# Patient Record
Sex: Female | Born: 1945
Health system: Southern US, Community
[De-identification: ages and names within clinical notes are randomized; demographics above are authoritative.]

## PROBLEM LIST (undated history)

## (undated) DIAGNOSIS — E039 Hypothyroidism, unspecified: Secondary | ICD-10-CM

## (undated) HISTORY — PX: MULTIPLE TOOTH EXTRACTIONS: SHX2053

## (undated) HISTORY — PX: TONSILLECTOMY: SUR1361

---

## 2000-08-17 ENCOUNTER — Ambulatory Visit (HOSPITAL_COMMUNITY): Admission: RE | Admit: 2000-08-17 | Discharge: 2000-08-17 | Payer: Self-pay | Admitting: Gastroenterology

## 2002-12-25 ENCOUNTER — Encounter: Payer: Self-pay | Admitting: Gastroenterology

## 2002-12-25 ENCOUNTER — Encounter: Admission: RE | Admit: 2002-12-25 | Discharge: 2002-12-25 | Payer: Self-pay | Admitting: Gastroenterology

## 2011-09-06 ENCOUNTER — Observation Stay (HOSPITAL_COMMUNITY)
Admission: EM | Admit: 2011-09-06 | Discharge: 2011-09-07 | Disposition: A | Discharge status: Home / Self Care | Payer: BC Managed Care – PPO | Attending: Internal Medicine | Admitting: Internal Medicine

## 2011-09-06 ENCOUNTER — Encounter: Payer: Self-pay | Admitting: *Deleted

## 2011-09-06 ENCOUNTER — Emergency Department (HOSPITAL_COMMUNITY): Payer: BC Managed Care – PPO

## 2011-09-06 ENCOUNTER — Other Ambulatory Visit: Payer: Self-pay

## 2011-09-06 DIAGNOSIS — E039 Hypothyroidism, unspecified: Secondary | ICD-10-CM | POA: Diagnosis present

## 2011-09-06 DIAGNOSIS — R42 Dizziness and giddiness: Secondary | ICD-10-CM | POA: Insufficient documentation

## 2011-09-06 DIAGNOSIS — Z79899 Other long term (current) drug therapy: Secondary | ICD-10-CM | POA: Insufficient documentation

## 2011-09-06 DIAGNOSIS — Z7982 Long term (current) use of aspirin: Secondary | ICD-10-CM | POA: Insufficient documentation

## 2011-09-06 DIAGNOSIS — R002 Palpitations: Secondary | ICD-10-CM | POA: Diagnosis present

## 2011-09-06 DIAGNOSIS — I4891 Unspecified atrial fibrillation: Principal | ICD-10-CM | POA: Insufficient documentation

## 2011-09-06 DIAGNOSIS — M542 Cervicalgia: Secondary | ICD-10-CM | POA: Insufficient documentation

## 2011-09-06 DIAGNOSIS — R0789 Other chest pain: Secondary | ICD-10-CM | POA: Insufficient documentation

## 2011-09-06 DIAGNOSIS — I499 Cardiac arrhythmia, unspecified: Secondary | ICD-10-CM

## 2011-09-06 HISTORY — DX: Hypothyroidism, unspecified: E03.9

## 2011-09-06 LAB — CARDIAC PANEL(CRET KIN+CKTOT+MB+TROPI)
CK, MB: 4.4 ng/mL — ABNORMAL HIGH (ref 0.3–4.0)
Relative Index: 1.9 (ref 0.0–2.5)
Total CK: 229 U/L — ABNORMAL HIGH (ref 7–177)
Troponin I: <0.3 ng/mL (ref <0.30)

## 2011-09-06 LAB — CBC
HCT: 39.8 % (ref 36.0–46.0)
Hemoglobin: 12.9 g/dL (ref 12.0–15.0)
MCH: 24.6 pg — ABNORMAL LOW (ref 26.0–34.0)
MCHC: 32.4 g/dL (ref 30.0–36.0)
MCV: 75.8 fL — ABNORMAL LOW (ref 78.0–100.0)
Platelets: 190 10*3/uL (ref 150–400)
RBC: 5.25 MIL/uL — ABNORMAL HIGH (ref 3.87–5.11)
RDW: 14.1 % (ref 11.5–15.5)
WBC: 6.3 10*3/uL (ref 4.0–10.5)

## 2011-09-06 LAB — COMPREHENSIVE METABOLIC PANEL
ALT: 23 U/L (ref 0–35)
AST: 30 U/L (ref 0–37)
Albumin: 3.9 g/dL (ref 3.5–5.2)
Alkaline Phosphatase: 90 U/L (ref 39–117)
BUN: 13 mg/dL (ref 6–23)
CO2: 29 mEq/L (ref 19–32)
Calcium: 9.6 mg/dL (ref 8.4–10.5)
Chloride: 102 mEq/L (ref 96–112)
Creatinine, Ser: 0.9 mg/dL (ref 0.50–1.10)
GFR calc Af Amer: 77 mL/min — ABNORMAL LOW (ref >90)
GFR calc non Af Amer: 66 mL/min — ABNORMAL LOW (ref >90)
Glucose, Bld: 100 mg/dL — ABNORMAL HIGH (ref 70–99)
Potassium: 4.2 mEq/L (ref 3.5–5.1)
Sodium: 138 mEq/L (ref 135–145)
Total Bilirubin: 0.4 mg/dL (ref 0.3–1.2)
Total Protein: 7.3 g/dL (ref 6.0–8.3)

## 2011-09-06 LAB — DIFFERENTIAL
Basophils Absolute: 0.1 10*3/uL (ref 0.0–0.1)
Basophils Relative: 1 % (ref 0–1)
Eosinophils Absolute: 0.3 10*3/uL (ref 0.0–0.7)
Eosinophils Relative: 5 % (ref 0–5)
Lymphocytes Relative: 36 % (ref 12–46)
Lymphs Abs: 2.3 10*3/uL (ref 0.7–4.0)
Monocytes Absolute: 0.6 10*3/uL (ref 0.1–1.0)
Monocytes Relative: 10 % (ref 3–12)
Neutro Abs: 3.1 10*3/uL (ref 1.7–7.7)
Neutrophils Relative %: 49 % (ref 43–77)

## 2011-09-06 LAB — TSH: TSH: 2.218 u[IU]/mL (ref 0.350–4.500)

## 2011-09-06 LAB — APTT: aPTT: 28 seconds (ref 24–37)

## 2011-09-06 LAB — PROTIME-INR
INR: 0.95 (ref 0.00–1.49)
Prothrombin Time: 12.9 seconds (ref 11.6–15.2)

## 2011-09-06 LAB — T4, FREE: Free T4: 0.61 ng/dL — ABNORMAL LOW (ref 0.80–1.80)

## 2011-09-06 MED ORDER — ASPIRIN EC 81 MG PO TBEC
81.0000 mg | DELAYED_RELEASE_TABLET | Freq: Every day | ORAL | Status: DC
  Administered 2011-09-07: 81 mg via ORAL
  Filled 2011-09-06 (×3): qty 1

## 2011-09-06 MED ORDER — FLUOXETINE HCL 20 MG PO CAPS
20.0000 mg | ORAL_CAPSULE | Freq: Every day | ORAL | Status: DC
  Administered 2011-09-07: 20 mg via ORAL
  Filled 2011-09-06 (×3): qty 1

## 2011-09-06 MED ORDER — ONDANSETRON HCL 4 MG PO TABS: 4.0000 mg | ORAL_TABLET | Freq: Four times a day (QID) | ORAL | Status: DC | PRN

## 2011-09-06 MED ORDER — ACETAMINOPHEN 650 MG RE SUPP: 650.0000 mg | Freq: Four times a day (QID) | RECTAL | Status: DC | PRN

## 2011-09-06 MED ORDER — ONDANSETRON HCL 4 MG/2ML IJ SOLN: 4.0000 mg | Freq: Four times a day (QID) | INTRAMUSCULAR | Status: DC | PRN

## 2011-09-06 MED ORDER — ACETAMINOPHEN 325 MG PO TABS: 650.0000 mg | ORAL_TABLET | Freq: Four times a day (QID) | ORAL | Status: DC | PRN

## 2011-09-06 NOTE — ED Notes (Signed)
Pt denies n/v, states "took a meclizine this a.m. For the dizziness"

## 2011-09-06 NOTE — Progress Notes (Signed)
Pt requesting not to have an IV if has nothing ordered.  "I have great veins if they need to start an IV for something"  Primary RN made aware and is calling MD.  Pt. Has nothing ordered IV except Zofran which is also ordered PO.

## 2011-09-06 NOTE — H&P (Signed)
PCP:   No primary provider on file.   Chief Complaint:  palpitations  HPI: This is a very pleasant 65 year old female, with past medical history of hypothyroidism. Patient has been self-medicating herself with thyroid replacement medicines including both levothyroxine and Cytomel. She doses herself based upon her symptoms. If she starts gaining weight, losing hair, feeling increasingly fatigued she will increase her dose of levothyroxine. If she starts to feel jittery and she will cut her dose in half. Today she was feeling jittery and was noted to have some palpitations. She took one dose of Inderal to help her symptoms. She reports recently having an upper respiratory tract infection on Friday and Saturday where she had cough and congestion. She had taken NyQuil for this and that has since resolved her symptoms. Today she complains of palpitations, lightheadedness, occasional left neck pain. She denies any chest pain, shortness of breath, nausea, vomiting, diarrhea, insomnia. She does feel very jittery. In the ER she was evaluated and was found to be in atrial fibrillation with rapid ventricular response. On EKG she was noted to have a heart rate at 118. She was monitored in the emergency room and has been referred for admission for further workup.  Allergies:  No Known Allergies    Past Medical History  Diagnosis Date  . Hypothyroidism     Past Surgical History  Procedure Date  . Tonsillectomy     Prior to Admission medications   Medication Sig Start Date End Date Taking? Authorizing Provider  aspirin EC 81 MG tablet Take 81 mg by mouth daily.     Yes Historical Provider, MD  FLUoxetine (PROZAC) 20 MG capsule Take 20 mg by mouth daily.     Yes Historical Provider, MD  levothyroxine (SYNTHROID, LEVOTHROID) 100 MCG tablet Take 100 mcg by mouth daily.     Yes Historical Provider, MD  liothyronine (CYTOMEL) 25 MCG tablet Take 25 mcg by mouth daily.     Yes Historical Provider, MD    meclizine (ANTIVERT) 25 MG tablet Take 25 mg by mouth 3 (three) times daily as needed. DIZZINESS    Yes Historical Provider, MD  Pseudoeph-Doxylamine-DM-APAP (NYQUIL) 60-7.02-09-999 MG/30ML LIQD Take 1 capsule by mouth every 6 (six) hours. COUGH/COLD    Yes Historical Provider, MD    Social History:  reports that she has quit smoking. She does not have any smokeless tobacco history on file. She reports that she drinks about .6 ounces of alcohol per week. She reports that she does not use illicit drugs. she is a former Engineer, civil (consulting), her husband is a retired Development worker, community, her daughter is a Diplomatic Services operational officer.  No family history on file.  Review of Systems: Positives on bold Constitutional: Denies fever, chills, diaphoresis, appetite change and fatigue.  HEENT: Denies photophobia, eye pain, redness, hearing loss, ear pain, congestion, sore throat, rhinorrhea, sneezing, mouth sores, trouble swallowing, neck pain, neck stiffness and tinnitus.   Respiratory: Denies SOB, DOE, cough, chest tightness,  and wheezing.   Cardiovascular: Denies chest pain, palpitations and leg swelling.  Gastrointestinal: Denies nausea, vomiting, abdominal pain, diarrhea, constipation, blood in stool and abdominal distention.  Genitourinary: Denies dysuria, urgency, frequency, hematuria, flank pain and difficulty urinating.  Musculoskeletal: Denies myalgias, back pain, joint swelling, arthralgias and gait problem.  Skin: Denies pallor, rash and wound.  Neurological: Denies dizziness, seizures, syncope, weakness, light-headedness, numbness and headaches.  Hematological: Denies adenopathy. Easy bruising, personal or family bleeding history  Psychiatric/Behavioral: Denies suicidal ideation, mood changes, confusion, nervousness, sleep disturbance and  agitation   Physical Exam: Blood pressure 149/47, pulse 54, temperature 98.9 F (37.2 C), temperature source Oral, resp. rate 19, height 5\' 9"  (1.753 m), weight 84.823 kg (187  lb), SpO2 100.00%. Gen.: In no acute distress, lying in bed, alert, oriented x3 HEENT: Normocephalic, atraumatic, pupils are equal round react to light Neck: Supple Chest: Clear to auscultation bilaterally Cardiac: S1, S2, regular rate and rhythm Abdomen: Soft, nontender, nondistended, bowel sounds are active Extremities: No cyanosis, clubbing, edema Neurologic: Patient has equal strength bilaterally, cranial nerves II through XII are grossly intact Skin is warm, no visible rashes  Labs on Admission:  Results for orders placed during the hospital encounter of 09/06/11 (from the past 48 hour(s))  CBC     Status: Abnormal   Collection Time   09/06/11  1:05 PM      Component Value Range Comment   WBC 6.3  4.0 - 10.5 (K/uL)    RBC 5.25 (*) 3.87 - 5.11 (MIL/uL)    Hemoglobin 12.9  12.0 - 15.0 (g/dL)    HCT 78.2  95.6 - 21.3 (%)    MCV 75.8 (*) 78.0 - 100.0 (fL)    MCH 24.6 (*) 26.0 - 34.0 (pg)    MCHC 32.4  30.0 - 36.0 (g/dL)    RDW 08.6  57.8 - 46.9 (%)    Platelets 190  150 - 400 (K/uL)   DIFFERENTIAL     Status: Normal   Collection Time   09/06/11  1:05 PM      Component Value Range Comment   Neutrophils Relative 49  43 - 77 (%)    Neutro Abs 3.1  1.7 - 7.7 (K/uL)    Lymphocytes Relative 36  12 - 46 (%)    Lymphs Abs 2.3  0.7 - 4.0 (K/uL)    Monocytes Relative 10  3 - 12 (%)    Monocytes Absolute 0.6  0.1 - 1.0 (K/uL)    Eosinophils Relative 5  0 - 5 (%)    Eosinophils Absolute 0.3  0.0 - 0.7 (K/uL)    Basophils Relative 1  0 - 1 (%)    Basophils Absolute 0.1  0.0 - 0.1 (K/uL)   PROTIME-INR     Status: Normal   Collection Time   09/06/11  1:05 PM      Component Value Range Comment   Prothrombin Time 12.9  11.6 - 15.2 (seconds)    INR 0.95  0.00 - 1.49    APTT     Status: Normal   Collection Time   09/06/11  1:05 PM      Component Value Range Comment   aPTT 28  24 - 37 (seconds)   COMPREHENSIVE METABOLIC PANEL     Status: Abnormal   Collection Time   09/06/11  1:05  PM      Component Value Range Comment   Sodium 138  135 - 145 (mEq/L)    Potassium 4.2  3.5 - 5.1 (mEq/L)    Chloride 102  96 - 112 (mEq/L)    CO2 29  19 - 32 (mEq/L)    Glucose, Bld 100 (*) 70 - 99 (mg/dL)    BUN 13  6 - 23 (mg/dL)    Creatinine, Ser 6.29  0.50 - 1.10 (mg/dL)    Calcium 9.6  8.4 - 10.5 (mg/dL)    Total Protein 7.3  6.0 - 8.3 (g/dL)    Albumin 3.9  3.5 - 5.2 (g/dL)    AST 30  0 -  37 (U/L)    ALT 23  0 - 35 (U/L)    Alkaline Phosphatase 90  39 - 117 (U/L)    Total Bilirubin 0.4  0.3 - 1.2 (mg/dL)    GFR calc non Af Amer 66 (*) >90 (mL/min)    GFR calc Af Amer 77 (*) >90 (mL/min)   CARDIAC PANEL(CRET KIN+CKTOT+MB+TROPI)     Status: Abnormal   Collection Time   09/06/11  1:05 PM      Component Value Range Comment   Total CK 229 (*) 7 - 177 (U/L)    CK, MB 4.4 (*) 0.3 - 4.0 (ng/mL)    Troponin I <0.30  <0.30 (ng/mL)    Relative Index 1.9  0.0 - 2.5      Radiological Exams on Admission: Dg Chest 2 View  09/06/2011  *RADIOLOGY REPORT*  Clinical Data: Chest pain.  Cardiac arrhythmia.  CHEST - 2 VIEW  Comparison: None.  Findings: Heart size is within normal limits.  Both lungs are clear.  No evidence of pleural effusion.  No mass or lymphadenopathy identified.  Mild thoracic spine degenerative changes noted.  IMPRESSION: No active cardiopulmonary disease.  Original Report Authenticated By: Danae Orleans, M.D.    Assessment/Plan Principal Problem:  *Atrial fibrillation with RVR Active Problems:  Hypothyroidism  Palpitations  Plan:  It has appeared that patient has converted to sinus rhythm. Her heart rate is in the high 50s to low 60s. It is likely that her self-medication of thyroid supplementation is the cause of her arrhythmia. TSH and T4 have already been sent. Patient will be admitted to a telemetry bed. We will monitor her rhythm to see if any there is any recurrence. We will cycle her cardiac markers as well as get a 2-D echocardiogram. I have advised her to  discontinue her Cytomel as she can likely be controlled with a steady dose of levothyroxine. She will need outpatient followup and repeat thyroid studies. For now, we will hold her thyroid replacement, until we get an idea of where her thyroid function is. The pseudoephedrine she had recently taken for her upper respiratory tract illness may have also contributed to her rapid rate. Patient's CHADS score is 0 so we will continue her on aspirin. I will hold off on starting her on any scheduled beta blockers since her heart rate is already in the 50s to 60s. We will further monitor her heart rate to see if she has any recurrence of tachyarrhythmias. If she does have recurrence then potentially a small dose of beta blockers may be added on a scheduled basis. We're hopeful for discharge in the next 24-48 hours.  Time Spent on Admission:  Bettyanne Dittman [facility] 09/06/2011, 2:44 PM

## 2011-09-06 NOTE — Progress Notes (Signed)
09/06/11 1832 MD notified RN that he wanted a saline lock started in patient. Karie Schwalbe RN

## 2011-09-06 NOTE — ED Notes (Signed)
VS taken @ 1144 documented on incorrect pt.

## 2011-09-06 NOTE — ED Provider Notes (Signed)
History     CSN: [id number]  Arrival date & time 09/06/11  1135   First MD Initiated Contact with Patient 09/06/11 1222      Chief Complaint  Patient presents with  . Chest Pain    (Consider location/radiation/quality/duration/timing/severity/associated sxs/prior treatment) Patient is a 65 y.o. female presenting with chest pain. The history is provided by the patient.  Chest Pain    patient here with palpitations and dizziness. Notes some cough vomiting and diarrhea. Patient took some Inderal 10 mg prior to arrival without relief of her symptoms. She is a sure of when the palpitations started. She does have a history of hypothyroidism and takes medications for this. Denies any prior history of atrial fibrillation. She only has chest discomfort when coughing. Diarrhea has been nonbloody vomiting has been nonbilious. She denies any rashes. Nothing makes her symptoms better.  Patient also took meclizine for dizziness as well, denies any headache or focal neurological deficits  Past Medical History  Diagnosis Date  . Hypothyroidism     Past Surgical History  Procedure Date  . Tonsillectomy     No family history on file.  History  Substance Use Topics  . Smoking status: Former Games developer  . Smokeless tobacco: Not on file  . Alcohol Use: 0.6 oz/week    1 Glasses of wine per week    OB History    Grav Para Term Preterm Abortions TAB SAB Ect Mult Living                  Review of Systems  Cardiovascular: Positive for chest pain.  All other systems reviewed and are negative.    Allergies  Review of patient's allergies indicates no known allergies.  Home Medications   Current Outpatient Rx  Name Route Sig Dispense Refill  . ASPIRIN EC 81 MG PO TBEC Oral Take 81 mg by mouth daily.      Marland Kitchen FLUOXETINE HCL 20 MG PO CAPS Oral Take 20 mg by mouth daily.      Marland Kitchen LEVOTHYROXINE SODIUM 100 MCG PO TABS Oral Take 100 mcg by mouth daily.      Marland Kitchen LIOTHYRONINE SODIUM 25 MCG PO TABS  Oral Take 25 mcg by mouth daily.      Marland Kitchen MECLIZINE HCL 25 MG PO TABS Oral Take 25 mg by mouth 3 (three) times daily as needed. DIZZINESS     . PSEUDOEPH-DOXYLAMINE-DM-APAP 60-7.02-09-999 MG/30ML PO LIQD Oral Take 1 capsule by mouth every 6 (six) hours. COUGH/COLD       BP 149/47  Pulse 54  Temp(Src) 98.9 F (37.2 C) (Oral)  Resp 19  Ht 5\' 9"  (1.753 m)  Wt 187 lb (84.823 kg)  BMI 27.62 kg/m2  SpO2 100%  Physical Exam  Nursing note and vitals reviewed. Constitutional: She is oriented to person, place, and time. She appears well-developed and well-nourished.  Non-toxic appearance. No distress.  HENT:  Head: Normocephalic and atraumatic.  Eyes: Conjunctivae, EOM and lids are normal. Pupils are equal, round, and reactive to light.  Neck: Normal range of motion. Neck supple. No tracheal deviation present. No mass present.  Cardiovascular: Normal heart sounds.  An irregularly irregular rhythm present. Tachycardia present.  Exam reveals no gallop.   No murmur heard. Pulmonary/Chest: Effort normal and breath sounds normal. No stridor. No respiratory distress. She has no decreased breath sounds. She has no wheezes. She has no rhonchi. She has no rales.  Abdominal: Soft. Normal appearance and bowel sounds are normal. She exhibits no  distension. There is no tenderness. There is no rebound and no CVA tenderness.  Musculoskeletal: Normal range of motion. She exhibits no edema and no tenderness.  Neurological: She is alert and oriented to person, place, and time. She has normal strength. No cranial nerve deficit or sensory deficit. GCS eye subscore is 4. GCS verbal subscore is 5. GCS motor subscore is 6.  Skin: Skin is warm and dry. No abrasion and no rash noted.  Psychiatric: She has a normal mood and affect. Her speech is normal and behavior is normal.    ED Course  Procedures (including critical care time)   Labs Reviewed  CBC  DIFFERENTIAL  PROTIME-INR  APTT  COMPREHENSIVE METABOLIC  PANEL  CARDIAC PANEL(CRET KIN+CKTOT+MB+TROPI)  T4, FREE  TSH   No results found.   No diagnosis found.    MDM   Date: 09/06/2011  Rate: 118  Rhythm: atrial fibrillation  QRS Axis: normal  Intervals: normal  ST/T Wave abnormalities: normal  Conduction Disutrbances:afib  Narrative Interpretation:   Old EKG Reviewed: unchanged    2:27 PM Patient's heart rate checked again she is in sinus rhythm at a rate of 55. She denies chest pain chest pressure. Patient had thyroid function studies drawn and are pending at this time. Patient to be admitted to the hospitalist for evaluation      Toy Baker, MD 09/06/11 1427

## 2011-09-07 ENCOUNTER — Other Ambulatory Visit: Payer: Self-pay

## 2011-09-07 LAB — T3, FREE: T3, Free: 3.1 pg/mL (ref 2.3–4.2)

## 2011-09-07 LAB — CBC
HCT: 35.3 % — ABNORMAL LOW (ref 36.0–46.0)
Hemoglobin: 11.6 g/dL — ABNORMAL LOW (ref 12.0–15.0)
MCH: 25 pg — ABNORMAL LOW (ref 26.0–34.0)
MCHC: 32.9 g/dL (ref 30.0–36.0)
MCV: 76.1 fL — ABNORMAL LOW (ref 78.0–100.0)
Platelets: 162 10*3/uL (ref 150–400)
RBC: 4.64 MIL/uL (ref 3.87–5.11)
RDW: 14.4 % (ref 11.5–15.5)
WBC: 5.3 10*3/uL (ref 4.0–10.5)

## 2011-09-07 LAB — BASIC METABOLIC PANEL
BUN: 14 mg/dL (ref 6–23)
CO2: 27 mEq/L (ref 19–32)
Calcium: 9.2 mg/dL (ref 8.4–10.5)
Chloride: 105 mEq/L (ref 96–112)
Creatinine, Ser: 1 mg/dL (ref 0.50–1.10)
GFR calc Af Amer: 67 mL/min — ABNORMAL LOW (ref >90)
GFR calc non Af Amer: 58 mL/min — ABNORMAL LOW (ref >90)
Glucose, Bld: 96 mg/dL (ref 70–99)
Potassium: 4 mEq/L (ref 3.5–5.1)
Sodium: 140 mEq/L (ref 135–145)

## 2011-09-07 LAB — CARDIAC PANEL(CRET KIN+CKTOT+MB+TROPI)
CK, MB: 3.2 ng/mL (ref 0.3–4.0)
CK, MB: 4.1 ng/mL — ABNORMAL HIGH (ref 0.3–4.0)
Relative Index: 2.2 (ref 0.0–2.5)
Relative Index: 2.3 (ref 0.0–2.5)
Total CK: 145 U/L (ref 7–177)
Total CK: 179 U/L — ABNORMAL HIGH (ref 7–177)
Troponin I: <0.3 ng/mL (ref <0.30)
Troponin I: <0.3 ng/mL (ref <0.30)

## 2011-09-07 MED ORDER — LEVOTHYROXINE SODIUM 50 MCG PO TABS: 50.0000 ug | ORAL_TABLET | Freq: Every day | ORAL | Status: DC

## 2011-09-07 NOTE — Progress Notes (Signed)
  Echocardiogram 2D Echocardiogram has been performed.  Juanita Laster Angelin Cutrone, RDCS 09/07/2011, 9:27 AM

## 2011-09-07 NOTE — Discharge Summary (Signed)
Physician Discharge Summary  Patient ID: Sara King MRN: [id number] DOB/AGE: 64-Feb-1947 64 y.o.  Admit date: 09/06/2011 Discharge date: 09/07/2011  Primary Care Physician:  No primary provider on file.   Discharge Diagnoses:    Principal Problem:  *Atrial fibrillation with RVR Active Problems:  Hypothyroidism  Palpitations    Discharge Medication List as of 09/07/2011 10:19 AM    CONTINUE these medications which have CHANGED   Details  levothyroxine (SYNTHROID) 50 MCG tablet Take 1 tablet (50 mcg total) by mouth daily., Starting 09/07/2011, Until Wed 09/06/12, Print      CONTINUE these medications which have NOT CHANGED   Details  aspirin EC 81 MG tablet Take 81 mg by mouth daily.  , Until Discontinued, Historical Med    FLUoxetine (PROZAC) 20 MG capsule Take 20 mg by mouth daily.  , Until Discontinued, Historical Med    meclizine (ANTIVERT) 25 MG tablet Take 25 mg by mouth 3 (three) times daily as needed. DIZZINESS , Until Discontinued, Historical Med      STOP taking these medications     liothyronine (CYTOMEL) 25 MCG tablet      Pseudoeph-Doxylamine-DM-APAP (NYQUIL) 60-7.02-09-999 MG/30ML LIQD          Disposition and Follow-up:  The patient will be discharged home today in stable and improved condition. She has been instructed to establish care with a new primary care physician in the area for followup on her hypothyroidism.  Consults:  none    Significant Diagnostic Studies:  No results found.  Brief H and P: For complete details please refer to admission H and P, but in brief patient is a very pleasant 64 year old white woman with past medical history only significant for hypothyroidism who came into the hospital with complaints of palpitations. Her husband is a retired Development worker, community and she has been getting prescriptions for thyroid replacement medications including Synthroid and Cytomel from him. She has not had routine lab work for her hypothyroidism.  She also recently had an upper respiratory infection and had taken over-the-counter NyQuil for this. In the emergency department she was found to be in atrial fibrillation with rapid ventricular response and we were asked to admit her for further evaluation and management.    Hospital Course:  Principal Problem:  *Atrial fibrillation with RVR Active Problems:  Hypothyroidism  Palpitations   #1 Atrial fibrillation with rapid ventricular response: This resolved spontaneously without intervention. I suspect this may have been secondary to over-the-counter medication containing decongestants used for her upper respiratory tract infection. She also doses her thyroid medications according to her symptoms, so if she feels increasingly fatigued or is gaining weight she will increase her dose of Synthroid and this may have precipitated some mild hyperthyroidism that may have resulted in atrial fibrillation. She converted spontaneously back to sinus rhythm with heart rates in the 60s.  #2 hypothyroidism: Her TSH was 2.218 which is normal, her free T4 however was low at 0.61. I have told her to start taking Synthroid 50 mcg daily and to followup with a primary care physician in 4-6 weeks for repeat thyroid function test to further assist with dosing adjustments. She has agreed to comply with this.  Time spent on Discharge: Greater than 30 minutes.  SignedChaya Jan [facility] Pager: [phone] 09/07/2011, 11:15 AM

## 2018-03-06 ENCOUNTER — Observation Stay (HOSPITAL_COMMUNITY)
Admission: EM | Admit: 2018-03-06 | Discharge: 2018-03-07 | Disposition: A | Discharge status: Home / Self Care | Payer: BLUE CROSS/BLUE SHIELD | Attending: Internal Medicine | Admitting: Internal Medicine

## 2018-03-06 ENCOUNTER — Other Ambulatory Visit: Payer: Self-pay

## 2018-03-06 ENCOUNTER — Emergency Department (HOSPITAL_COMMUNITY): Payer: BLUE CROSS/BLUE SHIELD

## 2018-03-06 ENCOUNTER — Encounter (HOSPITAL_COMMUNITY): Payer: Self-pay | Admitting: Pharmacy Technician

## 2018-03-06 DIAGNOSIS — Z87891 Personal history of nicotine dependence: Secondary | ICD-10-CM | POA: Insufficient documentation

## 2018-03-06 DIAGNOSIS — Z8249 Family history of ischemic heart disease and other diseases of the circulatory system: Secondary | ICD-10-CM | POA: Insufficient documentation

## 2018-03-06 DIAGNOSIS — R0789 Other chest pain: Principal | ICD-10-CM | POA: Insufficient documentation

## 2018-03-06 DIAGNOSIS — I4891 Unspecified atrial fibrillation: Secondary | ICD-10-CM | POA: Insufficient documentation

## 2018-03-06 DIAGNOSIS — Z9889 Other specified postprocedural states: Secondary | ICD-10-CM | POA: Diagnosis not present

## 2018-03-06 DIAGNOSIS — I420 Dilated cardiomyopathy: Secondary | ICD-10-CM | POA: Diagnosis not present

## 2018-03-06 DIAGNOSIS — R918 Other nonspecific abnormal finding of lung field: Secondary | ICD-10-CM | POA: Diagnosis not present

## 2018-03-06 DIAGNOSIS — Z7982 Long term (current) use of aspirin: Secondary | ICD-10-CM | POA: Diagnosis not present

## 2018-03-06 DIAGNOSIS — I493 Ventricular premature depolarization: Secondary | ICD-10-CM | POA: Diagnosis not present

## 2018-03-06 DIAGNOSIS — Z79899 Other long term (current) drug therapy: Secondary | ICD-10-CM | POA: Diagnosis not present

## 2018-03-06 DIAGNOSIS — Z79890 Hormone replacement therapy (postmenopausal): Secondary | ICD-10-CM | POA: Diagnosis not present

## 2018-03-06 DIAGNOSIS — E039 Hypothyroidism, unspecified: Secondary | ICD-10-CM

## 2018-03-06 DIAGNOSIS — R079 Chest pain, unspecified: Secondary | ICD-10-CM | POA: Diagnosis not present

## 2018-03-06 LAB — CBC WITH DIFFERENTIAL/PLATELET
Abs Immature Granulocytes: 0 10*3/uL (ref 0.0–0.1)
Basophils Absolute: 0.1 10*3/uL (ref 0.0–0.1)
Basophils Relative: 1 %
Eosinophils Absolute: 0.1 10*3/uL (ref 0.0–0.7)
Eosinophils Relative: 2 %
HCT: 40.7 % (ref 36.0–46.0)
Hemoglobin: 12.5 g/dL (ref 12.0–15.0)
Immature Granulocytes: 0 %
Lymphocytes Relative: 28 %
Lymphs Abs: 2.3 10*3/uL (ref 0.7–4.0)
MCH: 24.5 pg — ABNORMAL LOW (ref 26.0–34.0)
MCHC: 30.7 g/dL (ref 30.0–36.0)
MCV: 79.8 fL (ref 78.0–100.0)
Monocytes Absolute: 0.6 10*3/uL (ref 0.1–1.0)
Monocytes Relative: 7 %
Neutro Abs: 5 10*3/uL (ref 1.7–7.7)
Neutrophils Relative %: 62 %
Platelets: 202 10*3/uL (ref 150–400)
RBC: 5.1 MIL/uL (ref 3.87–5.11)
RDW: 14.2 % (ref 11.5–15.5)
WBC: 8 10*3/uL (ref 4.0–10.5)

## 2018-03-06 LAB — CBC
HCT: 39.1 % (ref 36.0–46.0)
Hemoglobin: 12.2 g/dL (ref 12.0–15.0)
MCH: 24.5 pg — ABNORMAL LOW (ref 26.0–34.0)
MCHC: 31.2 g/dL (ref 30.0–36.0)
MCV: 78.7 fL (ref 78.0–100.0)
Platelets: 193 10*3/uL (ref 150–400)
RBC: 4.97 MIL/uL (ref 3.87–5.11)
RDW: 14.1 % (ref 11.5–15.5)
WBC: 9.1 10*3/uL (ref 4.0–10.5)

## 2018-03-06 LAB — CREATININE, SERUM
Creatinine, Ser: 1.17 mg/dL — ABNORMAL HIGH (ref 0.44–1.00)
GFR calc Af Amer: 53 mL/min — ABNORMAL LOW (ref >60)
GFR calc non Af Amer: 46 mL/min — ABNORMAL LOW (ref >60)

## 2018-03-06 LAB — TROPONIN I
Troponin I: <0.03 ng/mL (ref <0.03)
Troponin I: <0.03 ng/mL (ref <0.03)

## 2018-03-06 LAB — BASIC METABOLIC PANEL
Anion gap: 13 (ref 5–15)
BUN: 19 mg/dL (ref 6–20)
CO2: 24 mmol/L (ref 22–32)
Calcium: 9.4 mg/dL (ref 8.9–10.3)
Chloride: 98 mmol/L — ABNORMAL LOW (ref 101–111)
Creatinine, Ser: 0.99 mg/dL (ref 0.44–1.00)
GFR calc Af Amer: >60 mL/min (ref >60)
GFR calc non Af Amer: 56 mL/min — ABNORMAL LOW (ref >60)
Glucose, Bld: 101 mg/dL — ABNORMAL HIGH (ref 65–99)
Potassium: 3.7 mmol/L (ref 3.5–5.1)
Sodium: 135 mmol/L (ref 135–145)

## 2018-03-06 MED ORDER — ONDANSETRON HCL 4 MG/2ML IJ SOLN: 4.0000 mg | Freq: Four times a day (QID) | INTRAMUSCULAR | Status: DC | PRN

## 2018-03-06 MED ORDER — FLUOXETINE HCL 20 MG PO CAPS
20.0000 mg | ORAL_CAPSULE | Freq: Every day | ORAL | Status: DC
  Administered 2018-03-07: 20 mg via ORAL
  Filled 2018-03-06: qty 1

## 2018-03-06 MED ORDER — ACETAMINOPHEN 325 MG PO TABS: 650.0000 mg | ORAL_TABLET | ORAL | Status: DC | PRN

## 2018-03-06 MED ORDER — ASPIRIN EC 325 MG PO TBEC
325.0000 mg | DELAYED_RELEASE_TABLET | Freq: Every day | ORAL | Status: DC
  Administered 2018-03-07: 325 mg via ORAL
  Filled 2018-03-06: qty 1

## 2018-03-06 MED ORDER — ENOXAPARIN SODIUM 40 MG/0.4ML ~~LOC~~ SOLN
40.0000 mg | SUBCUTANEOUS | Status: DC
  Administered 2018-03-06: 40 mg via SUBCUTANEOUS
  Filled 2018-03-06: qty 0.4

## 2018-03-06 MED ORDER — LEVOTHYROXINE SODIUM 100 MCG PO TABS
100.0000 ug | ORAL_TABLET | Freq: Every day | ORAL | Status: DC
  Administered 2018-03-07: 100 ug via ORAL
  Filled 2018-03-06: qty 1

## 2018-03-06 NOTE — ED Notes (Signed)
Patient transported to X-ray 

## 2018-03-06 NOTE — ED Triage Notes (Signed)
Pt arrives via EMS from home with reports of intermittent CP X2 months. Pt states pain starts in the jaw and radiates into her chest. Pt given 324mg  asa and 1 sl nitro en route.

## 2018-03-06 NOTE — H&P (Signed)
History and Physical    Sara King:[id number] DOB: May 29, 1946 DOA: 03/06/2018  PCP: Sara Corn, MD  Patient coming from: Home.  Chief Complaint: Chest pain.  HPI: Sara King is a 72 y.o. female with history of hypothyroidism and one episode of atrial fibrillation in 2012 which patient is attributing to taking Cytomel presents to the ER with complaints of chest pain.  Patient has benign chest pain off and on for last 1 week.  Retrosternal cramping in nature sometimes radiating to the neck.  Has no specific relation to exertion.  It happens about 45 minutes a day but since the pain happens more persistent today came to the ER.  Denies any nausea vomiting abdominal pain productive cough fever or chills.  ED Course: In the ER EKG shows sinus bradycardia and troponin was negative.  Chest pain improved with sublingual nitroglycerin which was given by the EMS.  Patient admitted for further management of chest pain.  Review of Systems: As per HPI, rest all negative.   Past Medical History:  Diagnosis Date  . Hypothyroidism     Past Surgical History:  Procedure Laterality Date  . MULTIPLE TOOTH EXTRACTIONS    . TONSILLECTOMY       reports that she quit smoking about 39 years ago. Her smoking use included cigarettes. She has never used smokeless tobacco. She reports that she drinks about 0.6 oz of alcohol per week. She reports that she does not use drugs.  No Known Allergies  Family History  Problem Relation Age of Onset  . CAD Mother   . Diabetes Mellitus II Neg Hx     Prior to Admission medications   Medication Sig Start Date End Date Taking? Authorizing Provider  aspirin EC 81 MG tablet Take 81 mg by mouth daily.     Yes [provider]  FLUoxetine (PROZAC) 20 MG capsule Take 20 mg by mouth daily.   Yes [provider]  levothyroxine (SYNTHROID, LEVOTHROID) 100 MCG tablet Take 100 mcg by mouth daily. 01/18/18  Yes [provider]    levothyroxine (SYNTHROID) 50 MCG tablet Take 1 tablet (50 mcg total) by mouth daily. 09/07/11 09/06/12  Henderson Cloud, MD    Physical Exam: Vitals:   03/06/18 1815 03/06/18 1845 03/06/18 1930 03/06/18 2030  BP: 122/73 129/69 120/66   Pulse: (!) 54 (!) 56 (!) 52 (!) 57  Resp: 18 15 18    Temp:      TempSrc:      SpO2: 96% 94% 95% 99%      Constitutional: Moderately built and nourished. Vitals:   03/06/18 1815 03/06/18 1845 03/06/18 1930 03/06/18 2030  BP: 122/73 129/69 120/66   Pulse: (!) 54 (!) 56 (!) 52 (!) 57  Resp: 18 15 18    Temp:      TempSrc:      SpO2: 96% 94% 95% 99%   Eyes: Anicteric no pallor. ENMT: No discharge from the ears eyes nose or mouth. Neck: No mass palpated no JVD appreciated. Respiratory: No rhonchi or crepitations. Cardiovascular: S1-S2 heard no murmurs appreciated. Abdomen: Soft nontender bowel sounds present. Musculoskeletal: No edema.  No joint effusion. Skin: No rash. Neurologic: Alert awake oriented to time place and person.  Moves all extremities. Psychiatric: Appears normal.  Normal affect.   Labs on Admission: I have personally reviewed following labs and imaging studies  CBC: Recent Labs  Lab 03/06/18 1747  WBC 8.0  NEUTROABS 5.0  HGB 12.5  HCT 40.7  MCV 79.8  PLT 202   Basic Metabolic Panel: Recent Labs  Lab 03/06/18 1747  NA 135  K 3.7  CL 98*  CO2 24  GLUCOSE 101*  BUN 19  CREATININE 0.99  CALCIUM 9.4   GFR: CrCl cannot be calculated (Unknown ideal weight.). Liver Function Tests: No results for input(s): AST, ALT, ALKPHOS, BILITOT, PROT, ALBUMIN in the last 168 hours. No results for input(s): LIPASE, AMYLASE in the last 168 hours. No results for input(s): AMMONIA in the last 168 hours. Coagulation Profile: No results for input(s): INR, PROTIME in the last 168 hours. Cardiac Enzymes: Recent Labs  Lab 03/06/18 1747  TROPONINI <0.03   BNP (last 3 results) No results for input(s): PROBNP in the  last 8760 hours. HbA1C: No results for input(s): HGBA1C in the last 72 hours. CBG: No results for input(s): GLUCAP in the last 168 hours. Lipid Profile: No results for input(s): CHOL, HDL, LDLCALC, TRIG, CHOLHDL, LDLDIRECT in the last 72 hours. Thyroid Function Tests: No results for input(s): TSH, T4TOTAL, FREET4, T3FREE, THYROIDAB in the last 72 hours. Anemia Panel: No results for input(s): VITAMINB12, FOLATE, FERRITIN, TIBC, IRON, RETICCTPCT in the last 72 hours. Urine analysis: No results found for: COLORURINE, APPEARANCEUR, LABSPEC, PHURINE, GLUCOSEU, HGBUR, BILIRUBINUR, KETONESUR, PROTEINUR, UROBILINOGEN, NITRITE, LEUKOCYTESUR Sepsis Labs: @LABRCNTIP (procalcitonin:4,lacticidven:4) )No results found for this or any previous visit (from the past 240 hour(s)).   Radiological Exams on Admission: Dg Chest 2 View  Result Date: 03/06/2018 CLINICAL DATA:  Pt c/o central chest pain x 1 day. No hx of heart or lung problems. Pt is a former smoker. EXAM: CHEST - 2 VIEW COMPARISON:  Chest x-ray dated 09/06/2011 FINDINGS: Heart size and mediastinal contours are within normal limits. Lungs are clear. Lungs are hyperexpanded. Coarse lung markings are noted bilaterally suggesting chronic interstitial lung disease. No new lung findings. No pleural effusion or pneumothorax seen. No acute or suspicious osseous finding. IMPRESSION: 1. No active cardiopulmonary disease. No evidence of pneumonia or pulmonary edema. 2. Hyperexpanded lungs suggesting COPD. Probable associated chronic interstitial lung disease. Electronically Signed   By: Bary RichardStan  Maynard M.D.   On: 03/06/2018 20:07    EKG: Independently reviewed.  Sinus bradycardia with rate around 57 bpm.  Poor R wave progression.  Assessment/Plan Principal Problem:   Chest pain Active Problems:   Hypothyroidism    1. Chest pain -has typical and atypical symptoms.  Since patient symptoms improved with sublingual nitroglycerin we will cycle cardiac markers  check 2D echo keep patient on aspirin.  Consult cardiology in a.m. 2. Hypothyroidism on Synthroid.  3. History of atrial fibrillation in 2012.  Has been having episodes of palpitation off and on.   DVT prophylaxis: Lovenox. Code Status: Full code. Family Communication: Patient's family at the bedside. Disposition Plan: Home. Consults called: Cardiology. Admission status: Observation.   Eduard ClosArshad N Kakrakandy MD [facility] Pager [phone].  If 7PM-7AM, please contact night-coverage [email or web address] Password [email or web address]  03/06/2018, 9:05 PM

## 2018-03-06 NOTE — ED Provider Notes (Signed)
[facility] Provider Note   CSN: [id number] Arrival date & time: 03/06/18  1727     History   Chief Complaint No chief complaint on file.   HPI Sara King is a 72 y.o. female.  HPI   72 year old female with chest pain.  Intermittent over the past 2 months.  She reports episodes where she gets pain in her neck/jaw with radiation into her chest.  Episodes last up to about 45 minutes.  She seems to notice it more in the afternoon but independent of activity.  She has not noticed any appreciable exacerbating or relieving factors otherwise.  She denies any associated symptoms such as nausea, diaphoresis or shortness of breath.  Denies any past cardiac history. She is coming in today because she had an episode which was more intense than previous   Past Medical History:  Diagnosis Date  . Hypothyroidism     Patient Active Problem List   Diagnosis Date Noted  . Atrial fibrillation with RVR (HCC) 09/06/2011  . Hypothyroidism 09/06/2011  . Palpitations 09/06/2011    Past Surgical History:  Procedure Laterality Date  . MULTIPLE TOOTH EXTRACTIONS    . TONSILLECTOMY       OB History   None      Home Medications    Prior to Admission medications   Medication Sig Start Date End Date Taking? Authorizing Provider  aspirin EC 81 MG tablet Take 81 mg by mouth daily.     Yes [provider]  FLUoxetine (PROZAC) 20 MG capsule Take 20 mg by mouth daily.   Yes [provider]  levothyroxine (SYNTHROID, LEVOTHROID) 100 MCG tablet Take 100 mcg by mouth daily. 01/18/18  Yes [provider]  levothyroxine (SYNTHROID) 50 MCG tablet Take 1 tablet (50 mcg total) by mouth daily. 09/07/11 09/06/12  Henderson CloudHernandez Acosta, Estela Y, MD    Family History No family history on file.  Social History Social History   Tobacco Use  . Smoking status: Former Smoker    Types: Cigarettes    Last attempt to quit: 08/06/1978    Years  since quitting: 39.6  Substance Use Topics  . Alcohol use: Yes    Alcohol/week: 0.6 oz    Types: 1 Glasses of wine per week  . Drug use: No     Allergies   Patient has no known allergies.   Review of Systems Review of Systems  All systems reviewed and negative, other than as noted in HPI.  Physical Exam Updated Vital Signs BP 132/80   Pulse (!) 56   Temp 98.4 F (36.9 C) (Oral)   Resp 17   SpO2 97%   Physical Exam  Constitutional: She appears well-developed and well-nourished. No distress.  HENT:  Head: Normocephalic and atraumatic.  Eyes: Conjunctivae are normal. Right eye exhibits no discharge. Left eye exhibits no discharge.  Neck: Neck supple.  Cardiovascular: Normal rate, regular rhythm and normal heart sounds. Exam reveals no gallop and no friction rub.  No murmur heard. Pulmonary/Chest: Effort normal and breath sounds normal. No respiratory distress.  Abdominal: Soft. She exhibits no distension. There is no tenderness.  Musculoskeletal: She exhibits no edema or tenderness.  Lower extremities symmetric as compared to each other. No calf tenderness. Negative Homan's. No palpable cords.   Neurological: She is alert.  Skin: Skin is warm and dry.  Psychiatric: She has a normal mood and affect. Her behavior is normal. Thought content normal.  Nursing note and vitals  reviewed.    ED Treatments / Results  Labs (all labs ordered are listed, but only abnormal results are displayed) Labs Reviewed  CBC WITH DIFFERENTIAL/PLATELET - Abnormal; Notable for the following components:      Result Value   MCH 24.5 (*)    All other components within normal limits  BASIC METABOLIC PANEL - Abnormal; Notable for the following components:   Chloride 98 (*)    Glucose, Bld 101 (*)    GFR calc non Af Amer 56 (*)    All other components within normal limits  CBC - Abnormal; Notable for the following components:   MCH 24.5 (*)    All other components within normal limits    CREATININE, SERUM - Abnormal; Notable for the following components:   Creatinine, Ser 1.17 (*)    GFR calc non Af Amer 46 (*)    GFR calc Af Amer 53 (*)    All other components within normal limits  TROPONIN I  TROPONIN I  TROPONIN I  TROPONIN I    EKG None  Radiology No results found.   Dg Chest 2 View  Result Date: 03/06/2018 CLINICAL DATA:  Pt c/o central chest pain x 1 day. No hx of heart or lung problems. Pt is a former smoker. EXAM: CHEST - 2 VIEW COMPARISON:  Chest x-ray dated 09/06/2011 FINDINGS: Heart size and mediastinal contours are within normal limits. Lungs are clear. Lungs are hyperexpanded. Coarse lung markings are noted bilaterally suggesting chronic interstitial lung disease. No new lung findings. No pleural effusion or pneumothorax seen. No acute or suspicious osseous finding. IMPRESSION: 1. No active cardiopulmonary disease. No evidence of pneumonia or pulmonary edema. 2. Hyperexpanded lungs suggesting COPD. Probable associated chronic interstitial lung disease. Electronically Signed   By: Bary Richard M.D.   On: 03/06/2018 20:07    Procedures Procedures (including critical care time)  Medications Ordered in ED Medications - No data to display   Initial Impression / Assessment and Plan / ED Course  I have reviewed the triage vital signs and the nursing notes.  Pertinent labs & imaging results that were available during my care of the patient were reviewed by me and considered in my medical decision making (see chart for details).     72yF with CP. Both typical and atypical features. I'm concerned that it seems to possibly be becoming more frequent/intense. EKG unremarkable.  Will admit for further evaluation.  Final Clinical Impressions(s) / ED Diagnoses   Final diagnoses:  Chest pain, unspecified type    ED Discharge Orders    None       Raeford Razor, MD 03/11/18 [phone]

## 2018-03-07 ENCOUNTER — Observation Stay (HOSPITAL_BASED_OUTPATIENT_CLINIC_OR_DEPARTMENT_OTHER): Payer: BLUE CROSS/BLUE SHIELD

## 2018-03-07 ENCOUNTER — Encounter (HOSPITAL_COMMUNITY): Payer: Self-pay | Admitting: Cardiology

## 2018-03-07 ENCOUNTER — Other Ambulatory Visit: Payer: Self-pay

## 2018-03-07 DIAGNOSIS — R079 Chest pain, unspecified: Secondary | ICD-10-CM

## 2018-03-07 DIAGNOSIS — I361 Nonrheumatic tricuspid (valve) insufficiency: Secondary | ICD-10-CM | POA: Diagnosis not present

## 2018-03-07 LAB — EXERCISE TOLERANCE TEST
Estimated workload: 10.1 METS
Exercise duration (min): 7 min
Exercise duration (sec): 0 s
MPHR: 149 {beats}/min
Peak HR: 134 {beats}/min
Percent HR: 89 %
Rest HR: 65 {beats}/min

## 2018-03-07 LAB — ECHOCARDIOGRAM COMPLETE
Height: 68 in
Weight: 2897.6 oz

## 2018-03-07 LAB — TROPONIN I
Troponin I: <0.03 ng/mL (ref <0.03)
Troponin I: <0.03 ng/mL (ref <0.03)

## 2018-03-07 MED ORDER — FAMOTIDINE 20 MG PO TABS
10.0000 mg | ORAL_TABLET | Freq: Two times a day (BID) | ORAL | Status: DC
  Administered 2018-03-07: 10 mg via ORAL
  Filled 2018-03-07: qty 1

## 2018-03-07 MED ORDER — FAMOTIDINE 10 MG PO TABS: 10.0000 mg | ORAL_TABLET | Freq: Two times a day (BID) | ORAL | 0 refills | Status: AC

## 2018-03-07 NOTE — Discharge Summary (Signed)
Physician Discharge Summary  Sara King ZOX:[id number] DOB: 1946/03/16 DOA: 03/06/2018  PCP: Creola Corn, MD  Admit date: 03/06/2018 Discharge date: 03/07/2018  Admitted From: home Discharge disposition: home   Recommendations for Outpatient Follow-Up:   1. Low risk stress test 2. Trial of pepcid/zantac 3. Outpatient PFTs for ? COPD   Discharge Diagnosis:   Principal Problem:   Chest pain Active Problems:   Hypothyroidism    Discharge Condition: Improved.  Diet recommendation: Low sodium, heart healthy  Wound care: None.  Code status: Full.   History of Present Illness:   Sara King is a 72 y.o. female with history of hypothyroidism and one episode of atrial fibrillation in 2012 which patient is attributing to taking Cytomel presents to the ER with complaints of chest pain.  Patient has benign chest pain off and on for last 1 week.  Retrosternal cramping in nature sometimes radiating to the neck.  Has no specific relation to exertion.  It happens about 45 minutes a day but since the pain happens more persistent today came to the ER.  Denies any nausea vomiting abdominal pain productive cough fever or chills.     Hospital Course by Problem:   Chest pain Per cards: Given r/o , normal echo and low risk ETT will d/c patient home Outpatient f/u with me in 4-6 weeks  -probably GI related-- start pepcid -outpatient PFTs    Medical Consultants:    cardiology  Discharge Exam:   Vitals:   03/07/18 0756 03/07/18 1135  BP: 112/67 113/70  Pulse: (!) 51 (!) 48  Resp: 18 16  Temp: 98.4 F (36.9 C) 98.8 F (37.1 C)  SpO2: 96% 97%   Vitals:   03/06/18 2129 03/07/18 0338 03/07/18 0756 03/07/18 1135  BP: (!) 142/76 128/61 112/67 113/70  Pulse: 60 (!) 55 (!) 51 (!) 48  Resp: 18 18 18 16   Temp: 98.2 F (36.8 C) 98.2 F (36.8 C) 98.4 F (36.9 C) 98.8 F (37.1 C)  TempSrc: Oral Oral Oral Oral  SpO2: 98% 98% 96% 97%  Weight: 82.7 kg (182 lb 6.4  oz) 82.1 kg (181 lb 1.6 oz)    Height: 5\' 8"  (1.727 m)       General exam: Appears calm and comfortable, no bruits     The results of significant diagnostics from this hospitalization (including imaging, microbiology, ancillary and laboratory) are listed below for reference.     Procedures and Diagnostic Studies:   Dg Chest 2 View  Result Date: 03/06/2018 CLINICAL DATA:  Pt c/o central chest pain x 1 day. No hx of heart or lung problems. Pt is a former smoker. EXAM: CHEST - 2 VIEW COMPARISON:  Chest x-ray dated 09/06/2011 FINDINGS: Heart size and mediastinal contours are within normal limits. Lungs are clear. Lungs are hyperexpanded. Coarse lung markings are noted bilaterally suggesting chronic interstitial lung disease. No new lung findings. No pleural effusion or pneumothorax seen. No acute or suspicious osseous finding. IMPRESSION: 1. No active cardiopulmonary disease. No evidence of pneumonia or pulmonary edema. 2. Hyperexpanded lungs suggesting COPD. Probable associated chronic interstitial lung disease. Electronically Signed   By: Bary Richard M.D.   On: 03/06/2018 20:07     Labs:   Basic Metabolic Panel: Recent Labs  Lab 03/06/18 1747 03/06/18 2140  NA 135  --   K 3.7  --   CL 98*  --   CO2 24  --   GLUCOSE 101*  --  BUN 19  --   CREATININE 0.99 1.17*  CALCIUM 9.4  --    GFR Estimated Creatinine Clearance: 49.6 mL/min (A) (by C-G formula based on SCr of 1.17 mg/dL (H)). Liver Function Tests: No results for input(s): AST, ALT, ALKPHOS, BILITOT, PROT, ALBUMIN in the last 168 hours. No results for input(s): LIPASE, AMYLASE in the last 168 hours. No results for input(s): AMMONIA in the last 168 hours. Coagulation profile No results for input(s): INR, PROTIME in the last 168 hours.  CBC: Recent Labs  Lab 03/06/18 1747 03/06/18 2140  WBC 8.0 9.1  NEUTROABS 5.0  --   HGB 12.5 12.2  HCT 40.7 39.1  MCV 79.8 78.7  PLT 202 193   Cardiac Enzymes: Recent Labs    Lab 03/06/18 1747 03/06/18 2140 03/07/18 0319 03/07/18 0842  TROPONINI <0.03 <0.03 <0.03 <0.03   BNP: Invalid input(s): POCBNP CBG: No results for input(s): GLUCAP in the last 168 hours. D-Dimer No results for input(s): DDIMER in the last 72 hours. Hgb A1c No results for input(s): HGBA1C in the last 72 hours. Lipid Profile No results for input(s): CHOL, HDL, LDLCALC, TRIG, CHOLHDL, LDLDIRECT in the last 72 hours. Thyroid function studies No results for input(s): TSH, T4TOTAL, T3FREE, THYROIDAB in the last 72 hours.  Invalid input(s): FREET3 Anemia work up No results for input(s): VITAMINB12, FOLATE, FERRITIN, TIBC, IRON, RETICCTPCT in the last 72 hours. Microbiology No results found for this or any previous visit (from the past 240 hour(s)).   Discharge Instructions:   Discharge Instructions    Diet - low sodium heart healthy   Complete by:  As directed    Discharge instructions   Complete by:  As directed    Trial of pepcid/zantac   Increase activity slowly   Complete by:  As directed      Allergies as of 03/07/2018   No Known Allergies     Medication List    TAKE these medications   aspirin EC 81 MG tablet Take 81 mg by mouth daily.   famotidine 10 MG tablet Commonly known as:  PEPCID Take 1 tablet (10 mg total) by mouth 2 (two) times daily.   FLUoxetine 20 MG capsule Commonly known as:  PROZAC Take 20 mg by mouth daily.   levothyroxine 100 MCG tablet Commonly known as:  SYNTHROID, LEVOTHROID Take 100 mcg by mouth daily. What changed:  Another medication with the same name was removed. Continue taking this medication, and follow the directions you see here.      Follow-up Information    Wendall StadeNishan, Peter C, MD Follow up.   Specialty:  Cardiology Why:  Cardiology follow up on October 15th at 9:00. You have been placed on a list to be called if any cancellations come up and could get you in sooner. Please call office if needed for symptoms and can get  in sooner to see a PA/NP.  Contact information: [address] [place] [address] [phone]        Creola Cornusso, John, MD Follow up in 1 week(s).   Specialty:  Internal Medicine Contact information: [address] [place] [address] [phone]            Time coordinating discharge: 35 min  Signed:  Joseph ArtJessica U Kazuki Ingle  [facility] 03/07/2018, 2:23 PM

## 2018-03-07 NOTE — Progress Notes (Signed)
  Echocardiogram 2D Echocardiogram has been performed.  Sara King  Sara King 03/07/2018, 11:11 AM

## 2018-03-07 NOTE — Progress Notes (Signed)
Pt returned from stress test, ordered lunch tray

## 2018-03-07 NOTE — Consult Note (Addendum)
Cardiology Consultation:   Patient ID: [name]; [id number]; [birth year]   Admit date: 03/06/2018 Date of Consult: 03/07/2018  Primary Care Provider: Creola Corn, MD Primary Cardiologist: Charlton Haws, MD   Patient Profile:   Sara King is a 72 y.o. female with a hx of parathyroidism and one episode of atrial fibrillation in 2012 which patient is attributing to taking Cytomel who is being seen today for the evaluation of chest pain at the request of Dr. Benjamine Mola.  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  Her only cardiac history is a brief episode of atrial fibrillation in 2012 that was attributed to concomitant therapy with Cytomel and levothyroxine.  The Cytomel was discontinued and she only has an occasional few minutes of palpitations about once a month.  She is very insistent that she did not want to go on warfarin at the time and still does not want anticoagulation.  She has had no MI or prior work-up.  She is followed by her internist for primary care.  She is a remote smoker from 1972-1978, none since.  She drinks 1 cocktail with vodka nightly.  She lives with her husband who is a retired Development worker, community.  She has a daughter who is a PA in the emergency room here and his son who is trying to get into medical school.  Ms. Standen has been having episodes of full pressure/pain in her neck that moves down to her lower chest as a knot/squeezing.  This lasts for about 45 minutes.  It started about 2 months ago, occurs 2-3 times per week in the afternoon usually.  She has no associated nausea, shortness of breath, lightheadedness, palpitations.  Yesterday she developed a severe episode of pain started in the neck and moved to the lower chest, felt as tightness.  She took some Mylanta but did not help.  Since she was at work her coworkers checked an EKG which they felt was abnormal and EMS was called.  She was given 4 aspirin at work and her pain  began to resolve by the time EMS arrived. They gave her a sublingual nitroglycerin.  Her acute pain resolved but she has still has a residual feeling of having been kicked in her chest.  Sara King does not exercise but she is fairly active without any exertional chest discomfort or dyspnea.  She is able to go hiking without any difficulty.  Home medications include only aspirin 81 mg, Prozac, Synthroid  Significant findings -Troponins negative x4 -Chest x-ray with no active cardiopulmonary disease, pneumonia or pulmonary edema.  Hyperexpanded lungs suggesting COPD, probable associated chronic interstitial lung disease. -EKG without acute ischemia -Echocardiogram pending  Past Medical History:  Diagnosis Date  . Hypothyroidism     Past Surgical History:  Procedure Laterality Date  . MULTIPLE TOOTH EXTRACTIONS    . TONSILLECTOMY       Home Medications:  Prior to Admission medications   Medication Sig Start Date End Date Taking? Authorizing Provider  aspirin EC 81 MG tablet Take 81 mg by mouth daily.     Yes [provider]  FLUoxetine (PROZAC) 20 MG capsule Take 20 mg by mouth daily.   Yes [provider]  levothyroxine (SYNTHROID, LEVOTHROID) 100 MCG tablet Take 100 mcg by mouth daily. 01/18/18  Yes [provider]  levothyroxine (SYNTHROID) 50 MCG tablet Take 1 tablet (50 mcg total) by mouth daily. 09/07/11 09/06/12  Philip Aspen, Limmie Patricia,  MD    Inpatient Medications: Scheduled Meds: . aspirin EC  325 mg Oral Daily  . enoxaparin (LOVENOX) injection  40 mg Subcutaneous Q24H  . famotidine  10 mg Oral BID  . FLUoxetine  20 mg Oral Daily  . levothyroxine  100 mcg Oral QAC breakfast   Continuous Infusions:  PRN Meds: acetaminophen, ondansetron (ZOFRAN) IV  Allergies:   No Known Allergies  Social History:   Social History   Socioeconomic History  . Marital status: Married    Spouse name: Not on file  . Number of children: Not on file  .  Years of education: Not on file  . Highest education level: Not on file  Occupational History  . Not on file  Social Needs  . Financial resource strain: Not on file  . Food insecurity:    Worry: Not on file    Inability: Not on file  . Transportation needs:    Medical: Not on file    Non-medical: Not on file  Tobacco Use  . Smoking status: Former Smoker    Types: Cigarettes    Last attempt to quit: 08/06/1978    Years since quitting: 39.6  . Smokeless tobacco: Never Used  Substance and Sexual Activity  . Alcohol use: Yes    Alcohol/week: 0.6 oz    Types: 1 Glasses of wine per week  . Drug use: No  . Sexual activity: Not Currently  Lifestyle  . Physical activity:    Days per week: Not on file    Minutes per session: Not on file  . Stress: Not on file  Relationships  . Social connections:    Talks on phone: Not on file    Gets together: Not on file    Attends religious service: Not on file    Active member of club or organization: Not on file    Attends meetings of clubs or organizations: Not on file    Relationship status: Not on file  . Intimate partner violence:    Fear of current or ex partner: Not on file    Emotionally abused: Not on file    Physically abused: Not on file    Forced sexual activity: Not on file  Other Topics Concern  . Not on file  Social History Narrative  . Not on file    Family History:    Family History  Problem Relation Age of Onset  . CAD Mother        MI in her 9070's lived to 6086  . Heart failure Mother   . Hypertension Mother   . Peripheral vascular disease Mother        carotid artery disease  . Atrial fibrillation Mother   . Atrial fibrillation Father   . COPD Father   . Colon cancer Father   . Healthy Sister   . Diabetes Mellitus II Neg Hx      ROS:  Please see the history of present illness.   All other ROS reviewed and negative.     Physical Exam/Data:   Vitals:   03/06/18 2129 03/07/18 0338 03/07/18 0756 03/07/18  1135  BP: (!) 142/76 128/61 112/67 113/70  Pulse: 60 (!) 55 (!) 51 (!) 48  Resp: 18 18 18 16   Temp: 98.2 F (36.8 C) 98.2 F (36.8 C) 98.4 F (36.9 C) 98.8 F (37.1 C)  TempSrc: Oral Oral Oral Oral  SpO2: 98% 98% 96% 97%  Weight: 182 lb 6.4 oz (82.7 kg) 181 lb 1.6  oz (82.1 kg)    Height: 5\' 8"  (1.727 m)       Intake/Output Summary (Last 24 hours) at 03/07/2018 1156 Last data filed at 03/07/2018 0300 Gross per 24 hour  Intake 240 ml  Output 2 ml  Net 238 ml   Filed Weights   03/06/18 2129 03/07/18 0338  Weight: 182 lb 6.4 oz (82.7 kg) 181 lb 1.6 oz (82.1 kg)   Body mass index is 27.54 kg/m.  General:  Well nourished, well developed, in no acute distress HEENT: normal Lymph: no adenopathy Neck: no JVD Endocrine:  No thryomegaly Vascular: No carotid bruits; FA pulses 2+ bilaterally without bruits  Cardiac:  normal S1, S2; RRR; faint systolic murmur RUSB Lungs:  clear to auscultation bilaterally, no wheezing, rhonchi or rales  Abd: soft, nontender, no hepatomegaly  Ext: no edema Musculoskeletal:  No deformities, BUE and BLE strength normal and equal Skin: warm and dry  Neuro:  CNs 2-12 intact, no focal abnormalities noted Psych:  Normal affect   EKG:  The EKG was personally reviewed and demonstrates: Sinus bradycardia at 57 bpm with late R wave progression Telemetry:  Telemetry was personally reviewed and demonstrates: Sinus bradycardia in the 50s to low 60s  Relevant CV Studies:  Echocardiogram pending  Laboratory Data:  Chemistry Recent Labs  Lab 03/06/18 1747 03/06/18 2140  NA 135  --   K 3.7  --   CL 98*  --   CO2 24  --   GLUCOSE 101*  --   BUN 19  --   CREATININE 0.99 1.17*  CALCIUM 9.4  --   GFRNONAA 56* 46*  GFRAA >60 53*  ANIONGAP 13  --     No results for input(s): PROT, ALBUMIN, AST, ALT, ALKPHOS, BILITOT in the last 168 hours. Hematology Recent Labs  Lab 03/06/18 1747 03/06/18 2140  WBC 8.0 9.1  RBC 5.10 4.97  HGB 12.5 12.2  HCT 40.7  39.1  MCV 79.8 78.7  MCH 24.5* 24.5*  MCHC 30.7 31.2  RDW 14.2 14.1  PLT 202 193   Cardiac Enzymes Recent Labs  Lab 03/06/18 1747 03/06/18 2140 03/07/18 0319 03/07/18 0842  TROPONINI <0.03 <0.03 <0.03 <0.03   No results for input(s): TROPIPOC in the last 168 hours.  BNPNo results for input(s): BNP, PROBNP in the last 168 hours.  DDimer No results for input(s): DDIMER in the last 168 hours.  Radiology/Studies:  Dg Chest 2 View  Result Date: 03/06/2018 CLINICAL DATA:  Pt c/o central chest pain x 1 day. No hx of heart or lung problems. Pt is a former smoker. EXAM: CHEST - 2 VIEW COMPARISON:  Chest x-ray dated 09/06/2011 FINDINGS: Heart size and mediastinal contours are within normal limits. Lungs are clear. Lungs are hyperexpanded. Coarse lung markings are noted bilaterally suggesting chronic interstitial lung disease. No new lung findings. No pleural effusion or pneumothorax seen. No acute or suspicious osseous finding. IMPRESSION: 1. No active cardiopulmonary disease. No evidence of pneumonia or pulmonary edema. 2. Hyperexpanded lungs suggesting COPD. Probable associated chronic interstitial lung disease. Electronically Signed   By: Bary Richard M.D.   On: 03/06/2018 20:07    Assessment and Plan:   1.  Chest pain -Troponins negative x4 -Chest x-ray with no active cardiopulmonary disease, pneumonia or pulmonary edema.  Hyperexpanded lungs suggesting COPD, probable associated chronic interstitial lung disease. -EKG without acute ischemia -Echocardiogram pending - The patient's symptoms are atypical, not occurring with activity.  There are no objective findings of myocardial ischemia.  We will discuss further evaluation with Dr. Eden Emms.  Will check exercise stress test today and if negative can likely discharge and if positive will consider cardiac CTA if appropriate based on test results.  2. Hypothyroidism -Patient is on thyroid replacement.  Followed by PCP with recent testing  approximately 2 months ago. -Patient is currently bradycardic with heart rates in the 50s and she reports that this is her baseline.   3. Remote history of atrial fibrillation in 2012 -A. fib with RVR resolved spontaneously and suspected to be secondary to concomitant use of Cytomel and levothyroxine.   Currently maintaining sinus rhythm. -The patient does report occasional brief episodes of palpitations occurring approximately once a month.  She is not convinced that this is atrial fibrillation.  She is adamant that she does not want to be anticoagulated.  For questions or updates, please contact [facility] Please consult [email or web address] for contact info under Cardiology/STEMI.   Signed, Berton Bon, NP  03/07/2018 11:56 AM  Patient examined chart reviewed. Atypical chest pain. Not exertional. ECG normal. Enzymes negative Exam with soft SEM otherwise normal. She has already eaten this am. Reviewed her echo from today and EF 60-65% no RWMAls Normal except for mild LAE 39 mm. Will try to schedule POET/ETT for today and d/c home if negative. If positive will discuss with patient inpatient/ vs outpatient f/u cardiac CTA   Charlton Haws

## 2018-03-07 NOTE — Progress Notes (Signed)
Reviewed patients ETT Started with flat ST segments resting ECG With stress less than .50 mm upsloping ST depression in inferior leads not thought to be significant Occassional PVCls Patient had no chest pain with exertion  Given r/o , normal echo and low risk ETT will d/c patient home Outpatient f/u with me in 4-6 weeks   Charlton HawsPeter Marquie Aderhold

## 2018-03-07 NOTE — Progress Notes (Signed)
PT to stress lab for ETT, pt denies cp or sob

## 2018-03-07 NOTE — Progress Notes (Signed)
Pt given AVS, pt aware she will get call from PCP for f.u appt, all questions entertained and answered, pt stable at discharge with daughter

## 2018-03-07 NOTE — Progress Notes (Signed)
    Ms. Sara King completed exercise stress test. She exercised for 7 minutes achieving 10 Mets with no chest pain. There was no significant ST depression with exercise. Normal blood pressure response. Low risk Duke Treadmill score. Reviewed with Dr. Eden EmmsNishan.   She can be discharged from a cardiac standpoint and follow up as an outpatient with Dr. Eden EmmsNishan. I will arrange appointment.   Sara King, AGNP-C [facility] 03/07/2018  1:32 PM Pager: [phone]

## 2018-06-19 NOTE — Progress Notes (Deleted)
Cardiology Office Note   Date:  06/19/2018   ID:  Sara King, DOB [birth year], MRN [id number]  PCP:  Creola Corn, MD  Cardiologist:   Charlton Haws, MD   No chief complaint on file.     History of Present Illness: Sara King is a 72 y.o. female who presents for f/u of PAF and chest pain Seen in consult hospital 03/06/18 Works at [employer]. Isolated episode of PAF in 2012 attributed to thyroid issues. Remote smoker quitting in 23'.  She drinks a vodka cocktail nightly R/O no acute ECG changes  TTE normal with EF 60-65% no valve disease ETT 03/07/18 normal with HTN response to exercise 210/70 mmHg.  Did 10.1 METS with peak HR 134 or 89% PMHR  ***   Past Medical History:  Diagnosis Date  . Hypothyroidism     Past Surgical History:  Procedure Laterality Date  . MULTIPLE TOOTH EXTRACTIONS    . TONSILLECTOMY       Current Outpatient Medications  Medication Sig Dispense Refill  . aspirin EC 81 MG tablet Take 81 mg by mouth daily.      . famotidine (PEPCID) 10 MG tablet Take 1 tablet (10 mg total) by mouth 2 (two) times daily. 60 tablet 0  . FLUoxetine (PROZAC) 20 MG capsule Take 20 mg by mouth daily.    Marland Kitchen levothyroxine (SYNTHROID, LEVOTHROID) 100 MCG tablet Take 100 mcg by mouth daily.  3   No current facility-administered medications for this visit.     Allergies:   Patient has no known allergies.    Social History:  The patient  reports that she quit smoking about 39 years ago. Her smoking use included cigarettes. She has never used smokeless tobacco. She reports that she drinks about 1.0 standard drinks of alcohol per week. She reports that she does not use drugs.   Family History:  The patient's family history includes Atrial fibrillation in her father and mother; CAD in her mother; COPD in her father; Colon cancer in her father; Healthy in her sister; Heart failure in her mother; Hypertension in her mother; Peripheral vascular disease in her mother.     ROS:  Please see the history of present illness.   Otherwise, review of systems are positive for none.   All other systems are reviewed and negative.    PHYSICAL EXAM: VS:  There were no vitals taken for this visit. , BMI There is no height or weight on file to calculate BMI. Affect appropriate Healthy:  appears stated age HEENT: normal Neck supple with no adenopathy JVP normal no bruits no thyromegaly Lungs clear with no wheezing and good diaphragmatic motion Heart:  S1/S2 SEM  murmur, no rub, gallop or click PMI normal Abdomen: benighn, BS positve, no tenderness, no AAA no bruit.  No HSM or HJR Distal pulses intact with no bruits No edema Neuro non-focal Skin warm and dry No muscular weakness    EKG:  SR rate 57 normal    Recent Labs: 03/06/2018: BUN 19; Creatinine, Ser 1.17; Hemoglobin 12.2; Platelets 193; Potassium 3.7; Sodium 135    Lipid Panel No results found for: CHOL, TRIG, HDL, CHOLHDL, VLDL, LDLCALC, LDLDIRECT    Wt Readings from Last 3 Encounters:  03/07/18 181 lb 1.6 oz (82.1 kg)  09/06/11 186 lb 1.1 oz (84.4 kg)      Other studies Reviewed: Additional studies/ records that were reviewed today include: consult note June TTE and ETT labs and ECG;s CXR .  ASSESSMENT AND PLAN:  1.  Chest Pain: atypical resolved normal ETT *** 2. PAF:  Distant in setting of thyroid issues patient did not want anticoagulation and not indicated now.  3. BP:  HTN response to exercise *** 4. Thyroid :  On replacement TSH normal f/u primary    Current medicines are reviewed at length with the patient today.  The patient does not have concerns regarding medicines.  The following changes have been made:  ***  Labs/ tests ordered today include: *** No orders of the defined types were placed in this encounter.    Disposition:   FU with cardiology PRN      Signed, Charlton Haws, MD  06/19/2018 11:14 AM    [facility] [address] [place],  [place], [place]  [address] Phone: [phone]; Fax: [phone]

## 2018-06-27 ENCOUNTER — Ambulatory Visit: Payer: BLUE CROSS/BLUE SHIELD | Admitting: Cardiovascular Disease

## 2018-11-24 ENCOUNTER — Other Ambulatory Visit: Payer: Self-pay | Admitting: Internal Medicine

## 2018-11-24 DIAGNOSIS — E781 Pure hyperglyceridemia: Secondary | ICD-10-CM

## 2018-12-01 ENCOUNTER — Other Ambulatory Visit: Payer: BLUE CROSS/BLUE SHIELD

## 2019-10-05 ENCOUNTER — Other Ambulatory Visit: Payer: Self-pay | Admitting: Infectious Diseases

## 2019-10-05 ENCOUNTER — Telehealth: Payer: Self-pay | Admitting: Infectious Diseases

## 2019-10-05 DIAGNOSIS — U071 COVID-19: Secondary | ICD-10-CM

## 2019-10-05 NOTE — Telephone Encounter (Signed)
Called to discuss with patient about Covid symptoms and the use of bamlanivimab, a monoclonal antibody infusion for those with mild to moderate Covid symptoms and at a high risk of hospitalization.  Pt is qualified for this infusion at the [facility] infusion center due to Age > 65.   Saturday 09/29/19 she woke up with sore throat and nasal congestion/mucus. She has had some drenching night sweats over this interval as well.   She had originally scheduled vaccine next week but has now cancelled.   Will schedule first available on 1/26

## 2019-10-05 NOTE — Progress Notes (Signed)
  I connected by phone with Sara King on 10/05/2019 at 3:08 PM to discuss the potential use of an new treatment for mild to moderate COVID-19 viral infection in non-hospitalized patients.  This patient is a 74 y.o. female that meets the FDA criteria for Emergency Use Authorization of bamlanivimab or casirivimab\imdevimab.  Has a (+) direct SARS-CoV-2 viral test result  Has mild or moderate COVID-19   Is ? 73 years of age and weighs ? 40 kg  Is NOT hospitalized due to COVID-19  Is NOT requiring oxygen therapy or requiring an increase in baseline oxygen flow rate due to COVID-19  Is within 10 days of symptom onset  Has at least one of the high risk factor(s) for progression to severe COVID-19 and/or hospitalization as defined in EUA.  Specific high risk criteria : >/= 73 yo   I have spoken and communicated the following to the patient or parent/caregiver:  1. FDA has authorized the emergency use of bamlanivimab and casirivimab\imdevimab for the treatment of mild to moderate COVID-19 in adults and pediatric patients with positive results of direct SARS-CoV-2 viral testing who are 69 years of age and older weighing at least 40 kg, and who are at high risk for progressing to severe COVID-19 and/or hospitalization.  2. The significant known and potential risks and benefits of bamlanivimab and casirivimab\imdevimab, and the extent to which such potential risks and benefits are unknown.  3. Information on available alternative treatments and the risks and benefits of those alternatives, including clinical trials.  4. Patients treated with bamlanivimab and casirivimab\imdevimab should continue to self-isolate and use infection control measures (e.g., wear mask, isolate, social distance, avoid sharing personal items, clean and disinfect "high touch" surfaces, and frequent handwashing) according to CDC guidelines.   5. The patient or parent/caregiver has the option to accept or refuse  bamlanivimab or casirivimab\imdevimab .  After reviewing this information with the patient, The patient agreed to proceed with receiving the bamlanimivab infusion and will be provided a copy of the Fact sheet prior to receiving the infusion.   Sara King 10/05/2019 3:08 PM

## 2019-10-09 ENCOUNTER — Ambulatory Visit (HOSPITAL_COMMUNITY)
Admission: RE | Admit: 2019-10-09 | Discharge: 2019-10-09 | Disposition: A | Discharge status: Home / Self Care | Payer: BC Managed Care – PPO | Source: Ambulatory Visit | Attending: Pulmonary Disease | Admitting: Pulmonary Disease

## 2019-10-09 DIAGNOSIS — U071 COVID-19: Secondary | ICD-10-CM | POA: Diagnosis present

## 2019-10-09 MED ORDER — METHYLPREDNISOLONE SODIUM SUCC 125 MG IJ SOLR: 125.0000 mg | Freq: Once | INTRAMUSCULAR | Status: DC | PRN

## 2019-10-09 MED ORDER — EPINEPHRINE 0.3 MG/0.3ML IJ SOAJ: 0.3000 mg | Freq: Once | INTRAMUSCULAR | Status: DC | PRN

## 2019-10-09 MED ORDER — SODIUM CHLORIDE 0.9 % IV SOLN
INTRAVENOUS | Status: DC | PRN
  Administered 2019-10-09: 250 mL via INTRAVENOUS

## 2019-10-09 MED ORDER — FAMOTIDINE IN NACL 20-0.9 MG/50ML-% IV SOLN: 20.0000 mg | Freq: Once | INTRAVENOUS | Status: DC | PRN

## 2019-10-09 MED ORDER — ALBUTEROL SULFATE HFA 108 (90 BASE) MCG/ACT IN AERS: 2.0000 | INHALATION_SPRAY | Freq: Once | RESPIRATORY_TRACT | Status: DC | PRN

## 2019-10-09 MED ORDER — SODIUM CHLORIDE 0.9 % IV SOLN
700.0000 mg | Freq: Once | INTRAVENOUS | Status: AC
  Administered 2019-10-09: 700 mg via INTRAVENOUS
  Filled 2019-10-09: qty 20

## 2019-10-09 MED ORDER — DIPHENHYDRAMINE HCL 50 MG/ML IJ SOLN: 50.0000 mg | Freq: Once | INTRAMUSCULAR | Status: DC | PRN

## 2019-10-09 NOTE — Progress Notes (Signed)
  Diagnosis: COVID-19  Physician: Dr Wright   Procedure: Covid Infusion Clinic Med: bamlanivimab infusion - Provided patient with bamlanimivab fact sheet for patients, parents and caregivers prior to infusion.  Complications: No immediate complications noted.  Discharge: Discharged home   Jairy Angulo 10/09/2019   

## 2019-10-09 NOTE — Discharge Instructions (Signed)

## 2020-01-05 ENCOUNTER — Ambulatory Visit: Payer: BC Managed Care – PPO | Attending: Internal Medicine

## 2020-01-05 DIAGNOSIS — Z23 Encounter for immunization: Secondary | ICD-10-CM

## 2020-01-05 NOTE — Progress Notes (Signed)
   Covid-19 Vaccination Clinic  Name:  [name]    MRN: [id number] DOB: 1945/10/31  01/05/2020  [name] was observed post Covid-19 immunization for 15 minutes without incident. She was provided with Vaccine Information Sheet and instruction to access the V-Safe system.   [name] was instructed to call 911 with any severe reactions post vaccine: Marland Kitchen Difficulty breathing  . Swelling of face and throat  . A fast heartbeat  . A bad rash all over body  . Dizziness and weakness   Immunizations Administered    Name Date Dose VIS Date Route   Pfizer COVID-19 Vaccine 01/05/2020  1:35 PM 0.3 mL 11/07/2018 Intramuscular   Manufacturer: [employer], [employer]   Lot: [id number]   NDC: [address]-3

## 2020-01-10 ENCOUNTER — Ambulatory Visit: Payer: BC Managed Care – PPO

## 2020-02-04 ENCOUNTER — Ambulatory Visit: Payer: BC Managed Care – PPO | Attending: Internal Medicine

## 2020-02-04 DIAGNOSIS — Z23 Encounter for immunization: Secondary | ICD-10-CM

## 2020-02-04 NOTE — Progress Notes (Signed)
   Covid-19 Vaccination Clinic  Name:  Sara King    MRN: [id number] DOB: [birth year]  02/04/2020  Ms. Sara King was observed post Covid-19 immunization for 15 minutes without incident. She was provided with Vaccine Information Sheet and instruction to access the V-Safe system.   Ms. Sara King was instructed to call 911 with any severe reactions post vaccine: Marland Kitchen Difficulty breathing  . Swelling of face and throat  . A fast heartbeat  . A bad rash all over body  . Dizziness and weakness   Immunizations Administered    Name Date Dose VIS Date Route   Pfizer COVID-19 Vaccine 02/04/2020  8:35 AM 0.3 mL 11/07/2018 Intramuscular   Manufacturer: [employer], [employer]   Lot: [id number]   NDC: [address]-3

## 2020-09-20 ENCOUNTER — Emergency Department (HOSPITAL_COMMUNITY)
Admission: EM | Admit: 2020-09-20 | Discharge: 2020-09-20 | Disposition: A | Discharge status: Home / Self Care | Payer: BC Managed Care – PPO | Attending: Emergency Medicine | Admitting: Emergency Medicine

## 2020-09-20 ENCOUNTER — Encounter (HOSPITAL_COMMUNITY): Payer: Self-pay

## 2020-09-20 ENCOUNTER — Other Ambulatory Visit: Payer: Self-pay

## 2020-09-20 ENCOUNTER — Emergency Department (HOSPITAL_COMMUNITY): Payer: BC Managed Care – PPO

## 2020-09-20 DIAGNOSIS — S3992XA Unspecified injury of lower back, initial encounter: Secondary | ICD-10-CM | POA: Insufficient documentation

## 2020-09-20 DIAGNOSIS — Z7982 Long term (current) use of aspirin: Secondary | ICD-10-CM | POA: Insufficient documentation

## 2020-09-20 DIAGNOSIS — W19XXXA Unspecified fall, initial encounter: Secondary | ICD-10-CM | POA: Insufficient documentation

## 2020-09-20 DIAGNOSIS — R55 Syncope and collapse: Secondary | ICD-10-CM | POA: Diagnosis not present

## 2020-09-20 DIAGNOSIS — S59902A Unspecified injury of left elbow, initial encounter: Secondary | ICD-10-CM | POA: Diagnosis not present

## 2020-09-20 DIAGNOSIS — Z79899 Other long term (current) drug therapy: Secondary | ICD-10-CM | POA: Diagnosis not present

## 2020-09-20 DIAGNOSIS — Z87891 Personal history of nicotine dependence: Secondary | ICD-10-CM | POA: Diagnosis not present

## 2020-09-20 DIAGNOSIS — E039 Hypothyroidism, unspecified: Secondary | ICD-10-CM | POA: Insufficient documentation

## 2020-09-20 DIAGNOSIS — M545 Low back pain, unspecified: Secondary | ICD-10-CM

## 2020-09-20 LAB — BASIC METABOLIC PANEL
Anion gap: 8 (ref 5–15)
BUN: 17 mg/dL (ref 8–23)
CO2: 27 mmol/L (ref 22–32)
Calcium: 9.4 mg/dL (ref 8.9–10.3)
Chloride: 103 mmol/L (ref 98–111)
Creatinine, Ser: 0.9 mg/dL (ref 0.44–1.00)
GFR, Estimated: >60 mL/min (ref >60)
Glucose, Bld: 130 mg/dL — ABNORMAL HIGH (ref 70–99)
Potassium: 4.3 mmol/L (ref 3.5–5.1)
Sodium: 138 mmol/L (ref 135–145)

## 2020-09-20 LAB — CBC WITH DIFFERENTIAL/PLATELET
Abs Immature Granulocytes: 0.05 10*3/uL (ref 0.00–0.07)
Basophils Absolute: 0 10*3/uL (ref 0.0–0.1)
Basophils Relative: 0 %
Eosinophils Absolute: 0 10*3/uL (ref 0.0–0.5)
Eosinophils Relative: 0 %
HCT: 42.3 % (ref 36.0–46.0)
Hemoglobin: 13.8 g/dL (ref 12.0–15.0)
Immature Granulocytes: 1 %
Lymphocytes Relative: 10 %
Lymphs Abs: 1 10*3/uL (ref 0.7–4.0)
MCH: 27.8 pg (ref 26.0–34.0)
MCHC: 32.6 g/dL (ref 30.0–36.0)
MCV: 85.1 fL (ref 80.0–100.0)
Monocytes Absolute: 0.4 10*3/uL (ref 0.1–1.0)
Monocytes Relative: 4 %
Neutro Abs: 8.9 10*3/uL — ABNORMAL HIGH (ref 1.7–7.7)
Neutrophils Relative %: 85 %
Platelets: 186 10*3/uL (ref 150–400)
RBC: 4.97 MIL/uL (ref 3.87–5.11)
RDW: 13.4 % (ref 11.5–15.5)
WBC: 10.4 10*3/uL (ref 4.0–10.5)
nRBC: 0 % (ref 0.0–0.2)

## 2020-09-20 MED ORDER — METHOCARBAMOL 1000 MG/10ML IJ SOLN
500.0000 mg | Freq: Once | INTRAVENOUS | Status: AC
  Administered 2020-09-20: 500 mg via INTRAVENOUS
  Filled 2020-09-20: qty 500

## 2020-09-20 MED ORDER — METHOCARBAMOL 500 MG PO TABS: 500.0000 mg | ORAL_TABLET | Freq: Two times a day (BID) | ORAL | 0 refills | Status: AC

## 2020-09-20 MED ORDER — ONDANSETRON HCL 4 MG/2ML IJ SOLN
4.0000 mg | Freq: Once | INTRAMUSCULAR | Status: AC
  Administered 2020-09-20: 4 mg via INTRAVENOUS
  Filled 2020-09-20: qty 2

## 2020-09-20 MED ORDER — HYDROMORPHONE HCL 1 MG/ML IJ SOLN
0.5000 mg | Freq: Once | INTRAMUSCULAR | Status: AC
  Administered 2020-09-20: 0.5 mg via INTRAVENOUS
  Filled 2020-09-20: qty 1

## 2020-09-20 NOTE — ED Provider Notes (Signed)
Patient placed in back brace for comfort.  CT of LS spine without fracture.  Will discharge home on Robaxin   Lorre Nick, MD 09/20/20 1023

## 2020-09-20 NOTE — ED Triage Notes (Signed)
Pt reports a fall yesterday around noon injuring left elbow. Pt has good ROM but sts a "tight" feeling. Around midnight she reports another fall injuring lower back. Difficulty ambulating after the fall. Denies LOC or head injury.

## 2020-09-20 NOTE — ED Provider Notes (Signed)
[facility]-EMERGENCY DEPT Provider Note   CSN: [id number] Arrival date & time: 09/20/20  6789     History Chief Complaint  Patient presents with  . Back Pain    Sara King is a 75 y.o. female.  Patient presents to the emergency department for evaluation after multiple falls.  Patient fell yesterday during the day and injured her left elbow.  She had been having a lot of pain in the elbow throughout the day.  Tonight she went to the bathroom and started to feel weak and dizzy.  She slumped off of the toilet, may have briefly passed out.  Since that occurred, patient experiencing severe low back pain.  Pain occasionally radiates to right buttock but does not radiate to the legs.  No weakness of lower extremities.  She did not hit her head, no loss of consciousness.  No neck pain, no upper back pain.        Past Medical History:  Diagnosis Date  . Hypothyroidism     Patient Active Problem List   Diagnosis Date Noted  . Chest pain 03/06/2018  . Atrial fibrillation with RVR (HCC) 09/06/2011  . Hypothyroidism 09/06/2011  . Palpitations 09/06/2011    Past Surgical History:  Procedure Laterality Date  . MULTIPLE TOOTH EXTRACTIONS    . TONSILLECTOMY       OB History   No obstetric history on file.     Family History  Problem Relation Age of Onset  . CAD Mother        MI in her 8's lived to 71  . Heart failure Mother   . Hypertension Mother   . Peripheral vascular disease Mother        carotid artery disease  . Atrial fibrillation Mother   . Atrial fibrillation Father   . COPD Father   . Colon cancer Father   . Healthy Sister   . Diabetes Mellitus II Neg Hx     Social History   Tobacco Use  . Smoking status: Former Smoker    Types: Cigarettes    Quit date: 08/06/1978    Years since quitting: 42.1  . Smokeless tobacco: Never Used  Substance Use Topics  . Alcohol use: Yes    Alcohol/week: 1.0 standard drink    Types: 1 Glasses of  wine per week  . Drug use: No    Home Medications Prior to Admission medications   Medication Sig Start Date End Date Taking? Authorizing Provider  aspirin EC 81 MG tablet Take 81 mg by mouth daily.      [provider]  famotidine (PEPCID) 10 MG tablet Take 1 tablet (10 mg total) by mouth 2 (two) times daily. 03/07/18   Joseph Art, DO  FLUoxetine (PROZAC) 20 MG capsule Take 20 mg by mouth daily.    [provider]  levothyroxine (SYNTHROID, LEVOTHROID) 100 MCG tablet Take 100 mcg by mouth daily. 01/18/18   [provider]    Allergies    Patient has no known allergies.  Review of Systems   Review of Systems  Musculoskeletal: Positive for arthralgias and back pain.  Neurological: Positive for syncope.  All other systems reviewed and are negative.   Physical Exam Updated Vital Signs BP 135/77 (BP Location: Right Arm)   Pulse 62   Temp 98.2 F (36.8 C) (Oral)   Resp 18   SpO2 99%   Physical Exam Vitals and nursing note reviewed.  Constitutional:  General: She is not in acute distress.    Appearance: Normal appearance. She is well-developed and well-nourished.  HENT:     Head: Normocephalic and atraumatic.     Right Ear: Hearing normal.     Left Ear: Hearing normal.     Nose: Nose normal.     Mouth/Throat:     Mouth: Oropharynx is clear and moist and mucous membranes are normal.  Eyes:     Extraocular Movements: EOM normal.     Conjunctiva/sclera: Conjunctivae normal.     Pupils: Pupils are equal, round, and reactive to light.  Cardiovascular:     Rate and Rhythm: Regular rhythm.     Heart sounds: S1 normal and S2 normal. No murmur heard. No friction rub. No gallop.   Pulmonary:     Effort: Pulmonary effort is normal. No respiratory distress.     Breath sounds: Normal breath sounds.  Chest:     Chest wall: No tenderness.  Abdominal:     General: Bowel sounds are normal.     Palpations: Abdomen is soft. There is no  hepatosplenomegaly.     Tenderness: There is no abdominal tenderness. There is no guarding or rebound. Negative signs include Murphy's sign and McBurney's sign.     Hernia: No hernia is present.  Musculoskeletal:        General: Normal range of motion.     Cervical back: Normal range of motion and neck supple.     Lumbar back: Tenderness present.       Back:  Skin:    General: Skin is warm, dry and intact.     Findings: No rash.     Nails: There is no cyanosis.  Neurological:     Mental Status: She is alert and oriented to person, place, and time.     GCS: GCS eye subscore is 4. GCS verbal subscore is 5. GCS motor subscore is 6.     Cranial Nerves: No cranial nerve deficit.     Sensory: No sensory deficit.     Coordination: Coordination normal.     Deep Tendon Reflexes: Strength normal.  Psychiatric:        Mood and Affect: Mood and affect normal.        Speech: Speech normal.        Behavior: Behavior normal.        Thought Content: Thought content normal.     ED Results / Procedures / Treatments   Labs (all labs ordered are listed, but only abnormal results are displayed) Labs Reviewed  CBC WITH DIFFERENTIAL/PLATELET  BASIC METABOLIC PANEL    EKG None  Radiology No results found.  Procedures Procedures (including critical care time)  Medications Ordered in ED Medications  HYDROmorphone (DILAUDID) injection 0.5 mg (has no administration in time range)  ondansetron (ZOFRAN) injection 4 mg (has no administration in time range)    ED Course  I have reviewed the triage vital signs and the nursing notes.  Pertinent labs & imaging results that were available during my care of the patient were reviewed by me and considered in my medical decision making (see chart for details).    MDM Rules/Calculators/A&P                          Presents with complaints of low back pain after a fall.  Patient had a syncopal episode of unclear etiology.  She became weak and dizzy  while on the toilet,  slumped to the ground and at that point injured her back.  Patient with nonradicular but severe lumbar pain.  Pain significantly worsens if she moves, signifying some element of spasm.  Will perform CT lumbar spine to further evaluate.  We will also obtain labs, EKG because of the syncope.  Patient fell yesterday injuring her left elbow, will perform x-ray.  Will sign to oncoming ER physician to follow-up on results.  Final Clinical Impression(s) / ED Diagnoses Final diagnoses:  Acute midline low back pain without sciatica  Syncope, unspecified syncope type    Rx / DC Orders ED Discharge Orders    None       Gilda Crease, MD 09/24/20 [phone]

## 2021-02-15 ENCOUNTER — Telehealth: Payer: BC Managed Care – PPO | Admitting: Family

## 2021-02-15 DIAGNOSIS — S0590XA Unspecified injury of unspecified eye and orbit, initial encounter: Secondary | ICD-10-CM

## 2021-02-15 NOTE — Progress Notes (Signed)
Based on what you shared with me, I feel your condition warrants further evaluation and I recommend that you be seen in a face to face office visit.  Given your symptoms you need to be seen face to face to be evaluated.    NOTE: If you entered your credit card information for this eVisit, you will not be charged. You may see a "hold" on your card for the $35 but that hold will drop off and you will not have a charge processed.   If you are having a true medical emergency please call 911.      For an urgent face to face visit, [facility] has six urgent care centers for your convenience:     [facility] at [facility] Directions [phone] [address], [place] [address] . 8 am - 4 pm Monday - Friday    [facility]) Get Driving Directions [phone] [address] [place], [place] [address] . 8 am to 8 pm Monday-Friday . 10 am to 6 pm [facility] Urgent [facility]) Get Driving Directions [phone]  [address] [place],  [place]  [address] . 8 am to 8 pm Monday-Friday . 8 am to 4 pm [facility] Urgent Care at [facility] Get Driving Directions [phone] [address] [place], [place] [address] . 8 am to 8 pm Monday-Friday . 8 am to 4 pm [facility] Urgent Care at [facility] Get Driving Directions  [phone] [address] [place], [place] [address] . 8 am to 8 pm Monday-Friday . 8 am to 4 pm [facility] Urgent Care at [facility] Directions [phone] [address] [place], [place] [address] . 8 am to 8 pm Monday-Friday . 8 am to 4 pm Saturday-Sunday     Your MyChart E-visit questionnaire answers were reviewed by a board certified advanced clinical practitioner to complete your personal care plan  based on your specific symptoms.  Thank you for using e-Visits.

## 2021-10-23 IMAGING — CR DG ELBOW COMPLETE 3+V*L*
4 series · 4 of 4 positions shown · non-contrast
Comparison: None.

CLINICAL DATA: Fall with left posterior elbow pain

EXAM:
LEFT ELBOW - COMPLETE 3+ VIEW

[x elbow obl left (1 of 2)]
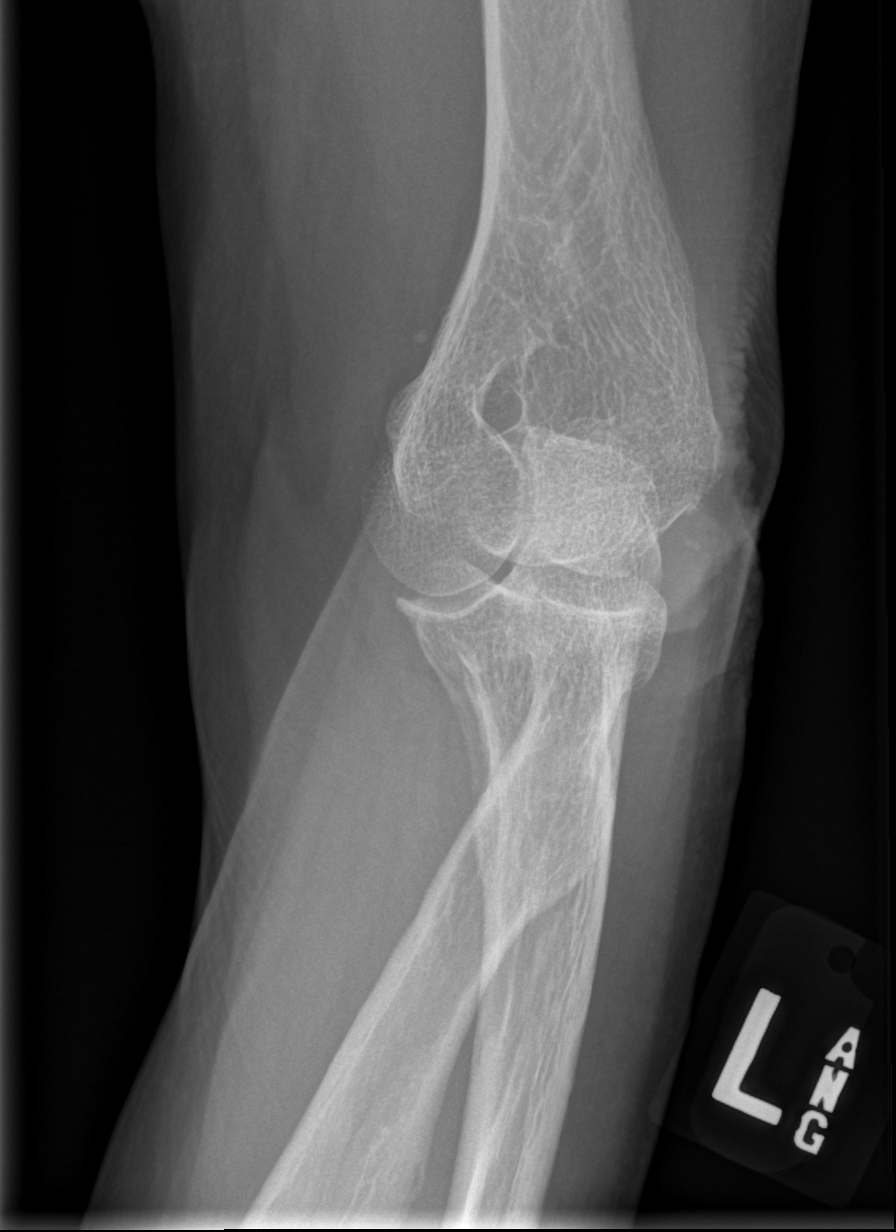

[x elbow lat left]
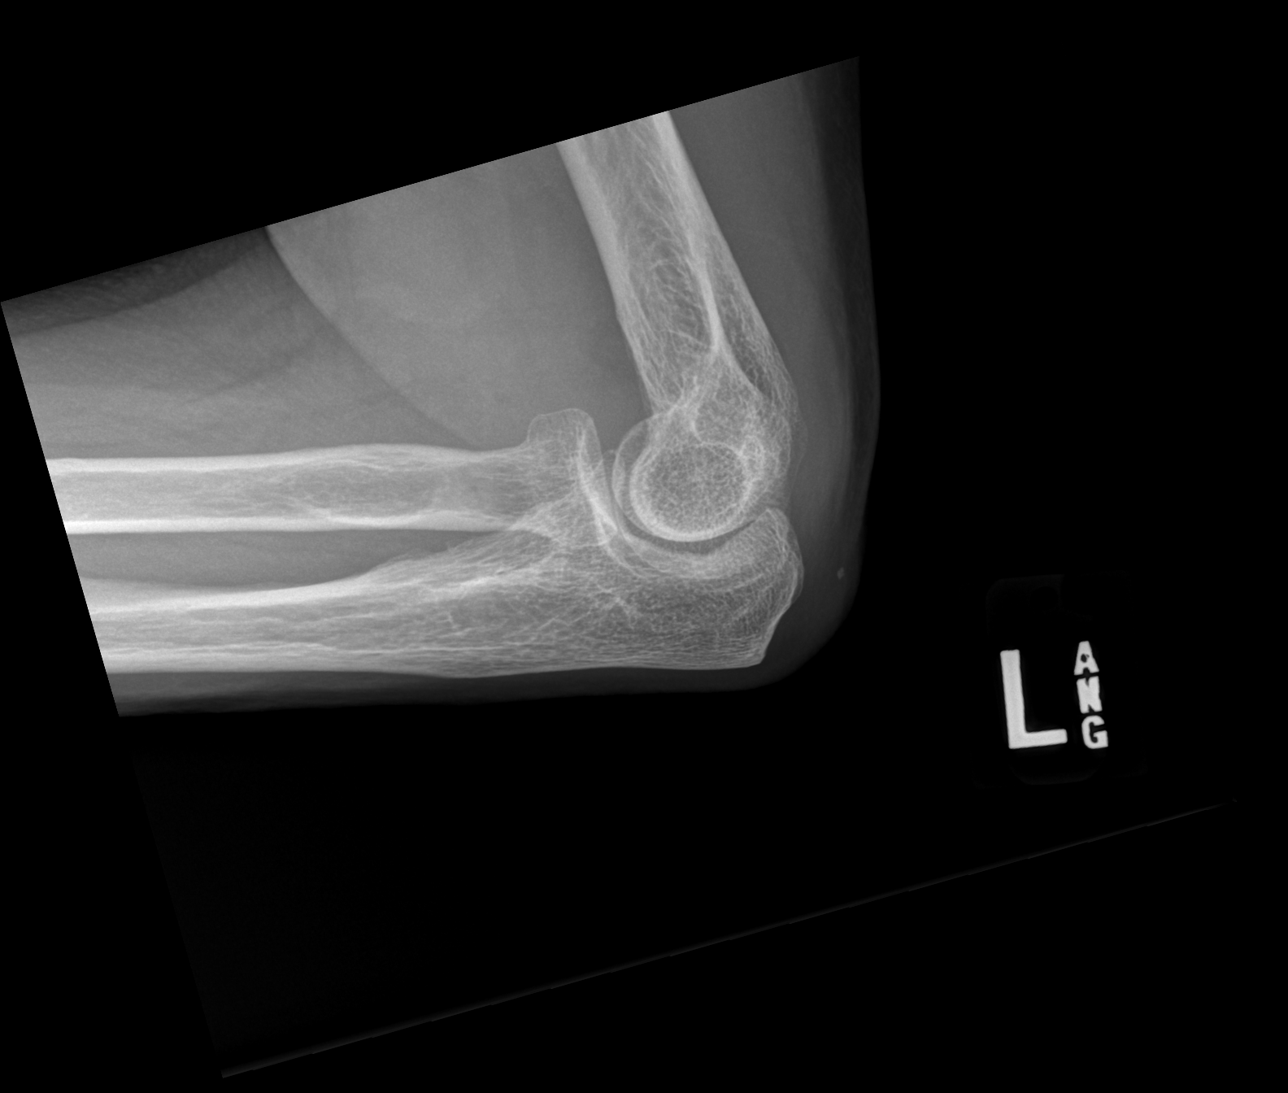

[x elbow ap left]
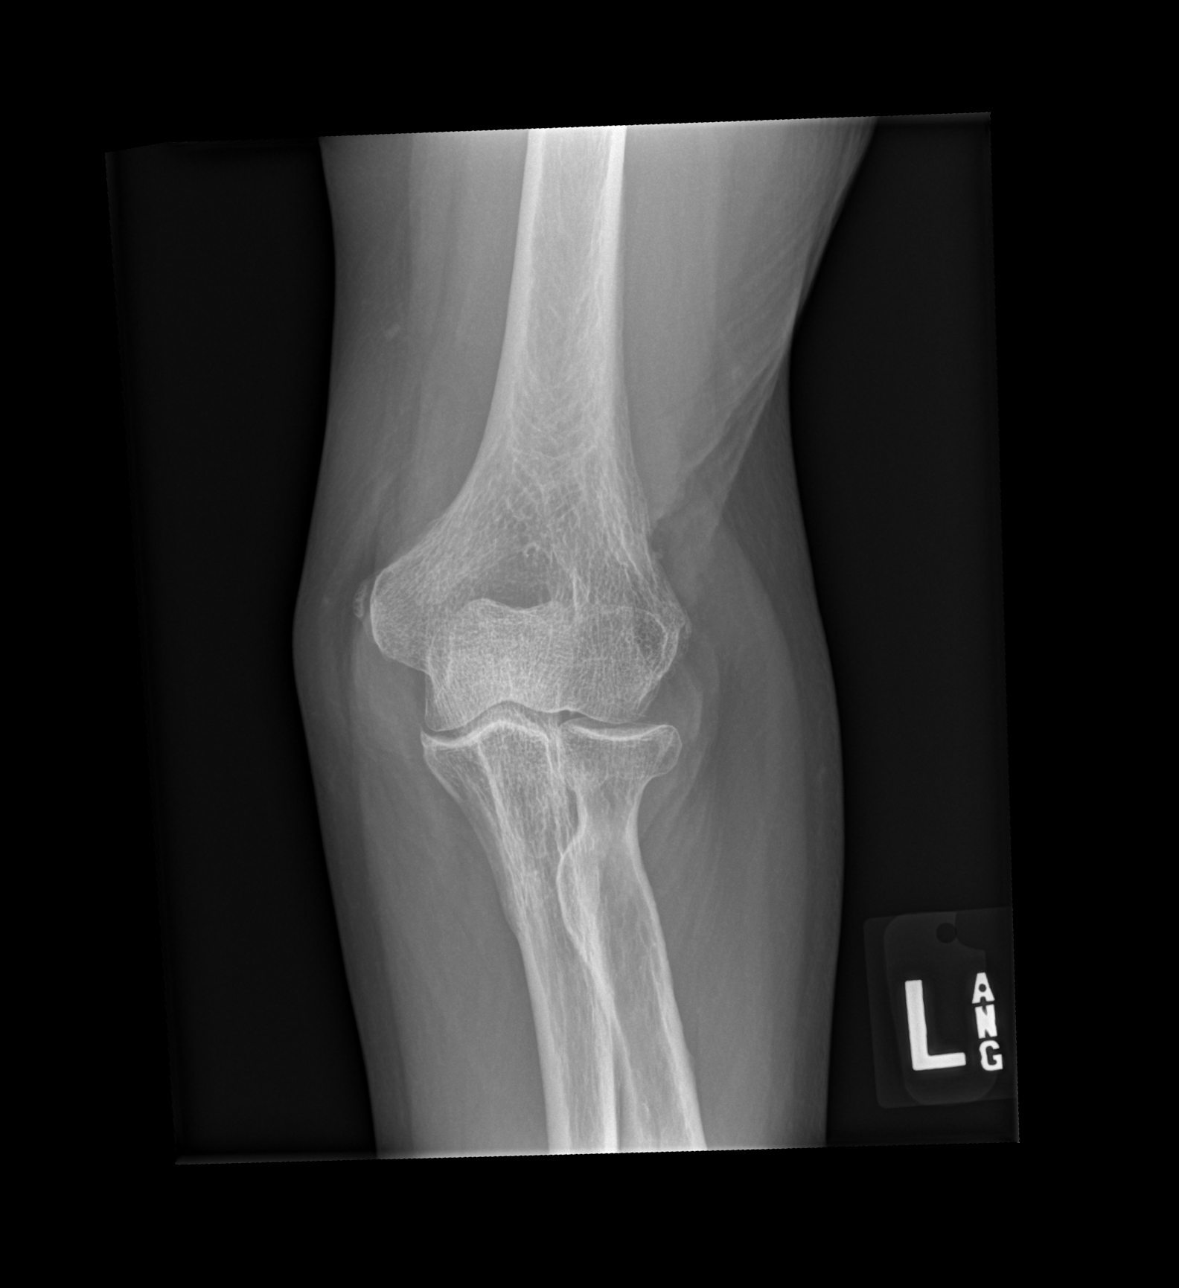

[x elbow obl left (2 of 2)]
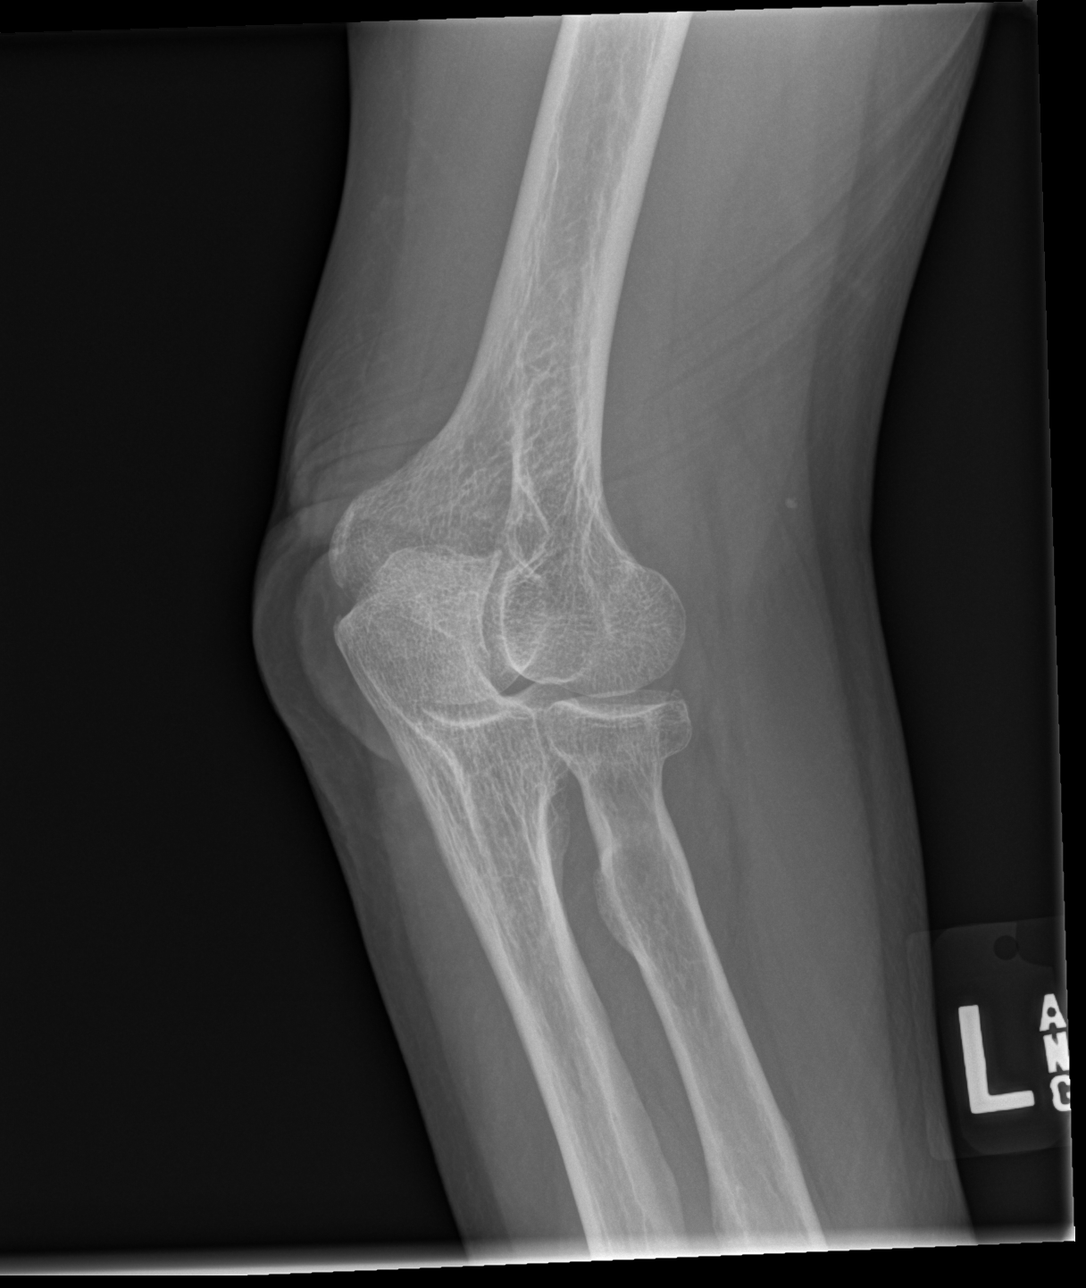

[4 of 4 positions shown; findings below may reference images not displayed]

FINDINGS: Elbow joint effusion with up lifted ventral fat pad. No visible
fracture or malalignment. No erosion or soft tissue calcification.
Osteopenia, diminishing sensitivity
IMPRESSION: Joint effusion without visible fracture. In the setting of trauma,
recommend follow-up for occult fracture.

## 2022-11-09 ENCOUNTER — Emergency Department (HOSPITAL_BASED_OUTPATIENT_CLINIC_OR_DEPARTMENT_OTHER)
Admission: EM | Admit: 2022-11-09 | Discharge: 2022-11-09 | Disposition: A | Discharge status: Home / Self Care | Payer: Managed Care, Other (non HMO) | Attending: Emergency Medicine | Admitting: Emergency Medicine

## 2022-11-09 ENCOUNTER — Encounter (HOSPITAL_BASED_OUTPATIENT_CLINIC_OR_DEPARTMENT_OTHER): Payer: Self-pay

## 2022-11-09 ENCOUNTER — Emergency Department (HOSPITAL_BASED_OUTPATIENT_CLINIC_OR_DEPARTMENT_OTHER): Payer: Managed Care, Other (non HMO)

## 2022-11-09 ENCOUNTER — Other Ambulatory Visit: Payer: Self-pay

## 2022-11-09 DIAGNOSIS — D72829 Elevated white blood cell count, unspecified: Secondary | ICD-10-CM | POA: Diagnosis not present

## 2022-11-09 DIAGNOSIS — K5792 Diverticulitis of intestine, part unspecified, without perforation or abscess without bleeding: Secondary | ICD-10-CM

## 2022-11-09 DIAGNOSIS — Z7982 Long term (current) use of aspirin: Secondary | ICD-10-CM | POA: Diagnosis not present

## 2022-11-09 DIAGNOSIS — K5732 Diverticulitis of large intestine without perforation or abscess without bleeding: Secondary | ICD-10-CM | POA: Diagnosis not present

## 2022-11-09 DIAGNOSIS — E278 Other specified disorders of adrenal gland: Secondary | ICD-10-CM | POA: Diagnosis not present

## 2022-11-09 DIAGNOSIS — R1032 Left lower quadrant pain: Secondary | ICD-10-CM | POA: Diagnosis present

## 2022-11-09 DIAGNOSIS — E039 Hypothyroidism, unspecified: Secondary | ICD-10-CM | POA: Insufficient documentation

## 2022-11-09 DIAGNOSIS — E279 Disorder of adrenal gland, unspecified: Secondary | ICD-10-CM

## 2022-11-09 LAB — CBC WITH DIFFERENTIAL/PLATELET
Abs Immature Granulocytes: 0.04 10*3/uL (ref 0.00–0.07)
Basophils Absolute: 0 10*3/uL (ref 0.0–0.1)
Basophils Relative: 0 %
Eosinophils Absolute: 0.1 10*3/uL (ref 0.0–0.5)
Eosinophils Relative: 1 %
HCT: 37.2 % (ref 36.0–46.0)
Hemoglobin: 12.4 g/dL (ref 12.0–15.0)
Immature Granulocytes: 0 %
Lymphocytes Relative: 19 %
Lymphs Abs: 2.1 10*3/uL (ref 0.7–4.0)
MCH: 26.8 pg (ref 26.0–34.0)
MCHC: 33.3 g/dL (ref 30.0–36.0)
MCV: 80.5 fL (ref 80.0–100.0)
Monocytes Absolute: 1 10*3/uL (ref 0.1–1.0)
Monocytes Relative: 9 %
Neutro Abs: 8.2 10*3/uL — ABNORMAL HIGH (ref 1.7–7.7)
Neutrophils Relative %: 71 %
Platelets: 188 10*3/uL (ref 150–400)
RBC: 4.62 MIL/uL (ref 3.87–5.11)
RDW: 13.1 % (ref 11.5–15.5)
WBC: 11.5 10*3/uL — ABNORMAL HIGH (ref 4.0–10.5)
nRBC: 0 % (ref 0.0–0.2)

## 2022-11-09 LAB — URINALYSIS, ROUTINE W REFLEX MICROSCOPIC
Bilirubin Urine: NEGATIVE
Glucose, UA: NEGATIVE mg/dL
Hgb urine dipstick: NEGATIVE
Ketones, ur: NEGATIVE mg/dL
Nitrite: NEGATIVE
Protein, ur: NEGATIVE mg/dL
Specific Gravity, Urine: 1.01 (ref 1.005–1.030)
pH: 6 (ref 5.0–8.0)

## 2022-11-09 LAB — COMPREHENSIVE METABOLIC PANEL
ALT: 16 U/L (ref 0–44)
AST: 20 U/L (ref 15–41)
Albumin: 3.4 g/dL — ABNORMAL LOW (ref 3.5–5.0)
Alkaline Phosphatase: 68 U/L (ref 38–126)
Anion gap: 5 (ref 5–15)
BUN: 14 mg/dL (ref 8–23)
CO2: 27 mmol/L (ref 22–32)
Calcium: 8.5 mg/dL — ABNORMAL LOW (ref 8.9–10.3)
Chloride: 104 mmol/L (ref 98–111)
Creatinine, Ser: 0.83 mg/dL (ref 0.44–1.00)
GFR, Estimated: >60 mL/min (ref >60)
Glucose, Bld: 108 mg/dL — ABNORMAL HIGH (ref 70–99)
Potassium: 3.9 mmol/L (ref 3.5–5.1)
Sodium: 136 mmol/L (ref 135–145)
Total Bilirubin: 1 mg/dL (ref 0.3–1.2)
Total Protein: 6.9 g/dL (ref 6.5–8.1)

## 2022-11-09 LAB — LIPASE, BLOOD: Lipase: 29 U/L (ref 11–51)

## 2022-11-09 LAB — URINALYSIS, MICROSCOPIC (REFLEX)

## 2022-11-09 MED ORDER — SODIUM CHLORIDE 0.9 % IV SOLN
1.0000 g | Freq: Once | INTRAVENOUS | Status: AC
  Administered 2022-11-09: 1 g via INTRAVENOUS
  Filled 2022-11-09: qty 10

## 2022-11-09 MED ORDER — OXYCODONE-ACETAMINOPHEN 5-325 MG PO TABS: 1.0000 | ORAL_TABLET | Freq: Four times a day (QID) | ORAL | 0 refills | Status: AC | PRN

## 2022-11-09 MED ORDER — AMOXICILLIN-POT CLAVULANATE 875-125 MG PO TABS: 1.0000 | ORAL_TABLET | Freq: Two times a day (BID) | ORAL | 0 refills | Status: AC

## 2022-11-09 MED ORDER — ONDANSETRON 4 MG PO TBDP: 4.0000 mg | ORAL_TABLET | Freq: Three times a day (TID) | ORAL | 0 refills | Status: AC | PRN

## 2022-11-09 MED ORDER — SODIUM CHLORIDE 0.9 % IV BOLUS
1000.0000 mL | Freq: Once | INTRAVENOUS | Status: AC
  Administered 2022-11-09: 1000 mL via INTRAVENOUS

## 2022-11-09 MED ORDER — IOHEXOL 300 MG/ML  SOLN
100.0000 mL | Freq: Once | INTRAMUSCULAR | Status: AC | PRN
  Administered 2022-11-09: 100 mL via INTRAVENOUS

## 2022-11-09 NOTE — ED Triage Notes (Signed)
Left lower abdominal pain for the past 3-4 days. Has never had diverticulitis. Denies fever. Urinary pressure.

## 2022-11-09 NOTE — ED Provider Notes (Signed)
[place] [facility] AT [facility] Provider Note   CSN: [id number] Arrival date & time: 11/09/22  1935     History {Add pertinent medical, surgical, social history, OB history to HPI:1} Chief Complaint  Patient presents with   Abdominal Pain    Sara King is a 77 y.o. female.  She has a history of hypothyroidism.  She is complaining of lower abdominal pain and left lower quadrant pain since 4 days.  It is associated with decreased stool frequency and caliber.  She said she feels very gassy.  She rates the pain a 6 out of 10.  No fevers chills nausea vomiting although she did say she felt very cold last night.  No urinary symptoms although she said her bladder feels very full.  No dysuria or hematuria.  She has tried nothing for her symptoms.  No prior surgical history.  The history is provided by the patient.  Abdominal Pain Pain location:  LLQ Pain quality: aching   Pain severity:  Moderate Onset quality:  Gradual Duration:  4 days Timing:  Constant Progression:  Unchanged Chronicity:  New Context: not trauma   Relieved by:  Nothing Worsened by:  Nothing Ineffective treatments:  Bowel activity Associated symptoms: flatus   Associated symptoms: no chest pain, no diarrhea, no dysuria, no fever, no hematemesis, no hematochezia, no hematuria, no nausea and no vomiting   Risk factors: has not had multiple surgeries        Home Medications Prior to Admission medications   Medication Sig Start Date End Date Taking? Authorizing Provider  aspirin EC 81 MG tablet Take 81 mg by mouth daily.      [provider]  famotidine (PEPCID) 10 MG tablet Take 1 tablet (10 mg total) by mouth 2 (two) times daily. 03/07/18   Geradine Girt, DO  FLUoxetine (PROZAC) 20 MG capsule Take 20 mg by mouth daily.    [provider]  levothyroxine (SYNTHROID, LEVOTHROID) 100 MCG tablet Take 100 mcg by mouth daily. 01/18/18   [provider]   methocarbamol (ROBAXIN) 500 MG tablet Take 1 tablet (500 mg total) by mouth 2 (two) times daily. 09/20/20   Lacretia Leigh, MD      Allergies    Patient has no known allergies.    Review of Systems   Review of Systems  Constitutional:  Negative for fever.  Cardiovascular:  Negative for chest pain.  Gastrointestinal:  Positive for abdominal pain and flatus. Negative for blood in stool, diarrhea, hematemesis, hematochezia, nausea and vomiting.  Genitourinary:  Negative for dysuria and hematuria.    Physical Exam Updated Vital Signs Ht '5\' 7"'$  (1.702 m)   Wt 78 kg   BMI 26.94 kg/m  Physical Exam Vitals and nursing note reviewed.  Constitutional:      General: She is not in acute distress.    Appearance: Normal appearance. She is well-developed.  HENT:     Head: Normocephalic and atraumatic.  Eyes:     Conjunctiva/sclera: Conjunctivae normal.  Cardiovascular:     Rate and Rhythm: Normal rate and regular rhythm.     Heart sounds: No murmur heard. Pulmonary:     Effort: Pulmonary effort is normal. No respiratory distress.     Breath sounds: Normal breath sounds.  Abdominal:     Palpations: Abdomen is soft. There is no mass.     Tenderness: There is abdominal tenderness in the left lower quadrant. There is guarding. There is no rebound.  Hernia: No hernia is present.  Musculoskeletal:        General: No swelling.     Cervical back: Neck supple.  Skin:    General: Skin is warm and dry.     Capillary Refill: Capillary refill takes less than 2 seconds.  Neurological:     General: No focal deficit present.     Mental Status: She is alert.     ED Results / Procedures / Treatments   Labs (all labs ordered are listed, but only abnormal results are displayed) Labs Reviewed - No data to display  EKG None  Radiology No results found.  Procedures Procedures  {Document cardiac monitor, telemetry assessment procedure when appropriate:1}  Medications Ordered in  ED Medications  sodium chloride 0.9 % bolus 1,000 mL (has no administration in time range)    ED Course/ Medical Decision Making/ A&P   {   Click here for ABCD2, HEART and other calculatorsREFRESH Note before signing :1}                          Medical Decision Making Amount and/or Complexity of Data Reviewed Labs: ordered. Radiology: ordered.   This patient complains of ***; this involves an extensive number of treatment Options and is a complaint that carries with it a high risk of complications and morbidity. The differential includes ***  I ordered, reviewed and interpreted labs, which included *** I ordered medication *** and reviewed PMP when indicated. I ordered imaging studies which included *** and I independently    visualized and interpreted imaging which showed *** Additional history obtained from *** Previous records obtained and reviewed *** I consulted *** and discussed lab and imaging findings and discussed disposition.  Cardiac monitoring reviewed, *** Social determinants considered, *** Critical Interventions: ***  After the interventions stated above, I reevaluated the patient and found *** Admission and further testing considered, ***   {Document critical care time when appropriate:1} {Document review of labs and clinical decision tools ie heart score, Chads2Vasc2 etc:1}  {Document your independent review of radiology images, and any outside records:1} {Document your discussion with family members, caretakers, and with consultants:1} {Document social determinants of health affecting pt's care:1} {Document your decision making why or why not admission, treatments were needed:1} Final Clinical Impression(s) / ED Diagnoses Final diagnoses:  None    Rx / DC Orders ED Discharge Orders     None

## 2022-11-09 NOTE — ED Notes (Signed)
Patient transported to CT 

## 2022-11-09 NOTE — Discharge Instructions (Signed)
You were seen in the emergency department for left lower quadrant abdominal pain.  Your white blood cell count was elevated and your CAT scan showed acute uncomplicated diverticulitis.  Please start Augmentin twice a day for a week.  Clear liquid diet advance as tolerated.  Follow-up with your regular doctor.  You also had an adrenal lesion on the right side that we will need an outpatient follow-up.  Return to the emergency department if any high fevers or worsening pain.

## 2022-12-11 ENCOUNTER — Encounter (HOSPITAL_COMMUNITY): Payer: Self-pay

## 2022-12-11 ENCOUNTER — Emergency Department (HOSPITAL_COMMUNITY): Payer: Managed Care, Other (non HMO)

## 2022-12-11 ENCOUNTER — Emergency Department (HOSPITAL_COMMUNITY)
Admission: EM | Admit: 2022-12-11 | Discharge: 2022-12-11 | Disposition: A | Discharge status: Home / Self Care | Payer: Managed Care, Other (non HMO) | Attending: Emergency Medicine | Admitting: Emergency Medicine

## 2022-12-11 DIAGNOSIS — M898X1 Other specified disorders of bone, shoulder: Secondary | ICD-10-CM

## 2022-12-11 DIAGNOSIS — Z79899 Other long term (current) drug therapy: Secondary | ICD-10-CM | POA: Insufficient documentation

## 2022-12-11 DIAGNOSIS — E039 Hypothyroidism, unspecified: Secondary | ICD-10-CM | POA: Insufficient documentation

## 2022-12-11 DIAGNOSIS — M25512 Pain in left shoulder: Secondary | ICD-10-CM | POA: Diagnosis not present

## 2022-12-11 DIAGNOSIS — S0083XA Contusion of other part of head, initial encounter: Secondary | ICD-10-CM | POA: Diagnosis not present

## 2022-12-11 DIAGNOSIS — W01198A Fall on same level from slipping, tripping and stumbling with subsequent striking against other object, initial encounter: Secondary | ICD-10-CM | POA: Insufficient documentation

## 2022-12-11 DIAGNOSIS — S0990XA Unspecified injury of head, initial encounter: Secondary | ICD-10-CM | POA: Diagnosis present

## 2022-12-11 DIAGNOSIS — Z7982 Long term (current) use of aspirin: Secondary | ICD-10-CM | POA: Diagnosis not present

## 2022-12-11 DIAGNOSIS — W19XXXA Unspecified fall, initial encounter: Secondary | ICD-10-CM

## 2022-12-11 NOTE — Discharge Instructions (Signed)
Thank you for allowing me to be a part of your care today.    Your x-ray and CT scans were negative for acute injury.  I recommend taking NSAIDs as needed for musculoskeletal pain.   Return to the ED if you have worsening of your symptoms or if you have any new concerns.

## 2022-12-11 NOTE — ED Triage Notes (Signed)
Pt arrives today after tripping over gardening equipment. No thinners. Pt fell on cement, hitting the left side of her forehead. Pt also c/o left scapula pain. Pt ambulatory to triage. No dizziness. No LOC. Pt did have a brief moment of nausea immediately after, but has since subsided.

## 2022-12-11 NOTE — ED Provider Notes (Signed)
[facility] Provider Note   CSN: [id number] Arrival date & time: 12/11/22  1237     History  Chief Complaint  Patient presents with   Sara King is a 77 y.o. female with past medical history significant for hypothyroidism presents to the ED after a mechanical fall.  Patient complaining of pain to her left scapula and the left side of her forehead.  She states that was in her garden when she tripped over some of the gardening equipment, fell, and struck the left side of her forehead on the cement ground.  She is also reporting some left scapular pain, but is able to move her shoulder and arm.  She did have a brief moment of nausea immediately after her fall, but it resolved very quickly.  She is not anticoagulated.  Denies loss of consciousness, dizziness, weakness, numbness, headache, lightheadedness, back pain, neck pain, difficulty ambulating.         Home Medications Prior to Admission medications   Medication Sig Start Date End Date Taking? Authorizing Provider  amoxicillin-clavulanate (AUGMENTIN) 875-125 MG tablet Take 1 tablet by mouth every 12 (twelve) hours. 11/09/22   Hayden Rasmussen, MD  aspirin EC 81 MG tablet Take 81 mg by mouth daily.      [provider]  famotidine (PEPCID) 10 MG tablet Take 1 tablet (10 mg total) by mouth 2 (two) times daily. 03/07/18   Geradine Girt, DO  FLUoxetine (PROZAC) 20 MG capsule Take 20 mg by mouth daily.    [provider]  levothyroxine (SYNTHROID, LEVOTHROID) 100 MCG tablet Take 100 mcg by mouth daily. 01/18/18   [provider]  methocarbamol (ROBAXIN) 500 MG tablet Take 1 tablet (500 mg total) by mouth 2 (two) times daily. 09/20/20   Lacretia Leigh, MD  ondansetron (ZOFRAN-ODT) 4 MG disintegrating tablet Take 1 tablet (4 mg total) by mouth every 8 (eight) hours as needed for nausea or vomiting. 11/09/22   Hayden Rasmussen, MD  oxyCODONE-acetaminophen  (PERCOCET/ROXICET) 5-325 MG tablet Take 1 tablet by mouth every 6 (six) hours as needed for severe pain. 11/09/22   Hayden Rasmussen, MD      Allergies    Patient has no known allergies.    Review of Systems   Review of Systems  Musculoskeletal:  Negative for back pain, gait problem and neck pain.       Left scapula pain  Neurological:  Negative for dizziness, syncope, weakness, light-headedness and numbness.    Physical Exam Updated Vital Signs BP (!) 140/77 (BP Location: Right Arm)   Pulse 67   Temp 98.5 F (36.9 C) (Oral)   Resp 18   Ht 5\' 7"  (1.702 m)   Wt 77.1 kg   SpO2 95%   BMI 26.63 kg/m  Physical Exam Vitals and nursing note reviewed.  Constitutional:      General: She is not in acute distress.    Appearance: Normal appearance. She is not ill-appearing or diaphoretic.  HENT:     Head: Normocephalic. Contusion present. No raccoon eyes, Battle's sign, right periorbital erythema, left periorbital erythema or laceration.     Jaw: There is normal jaw occlusion.   Cardiovascular:     Rate and Rhythm: Normal rate and regular rhythm.  Pulmonary:     Effort: Pulmonary effort is normal.  Musculoskeletal:     Right shoulder: Normal.     Left shoulder: Tenderness present. No swelling, deformity  or bony tenderness. Decreased range of motion (mild due to discomfort). Normal strength. Normal pulse.       Arms:     Cervical back: Normal. No bony tenderness. No pain with movement or muscular tenderness. Normal range of motion.     Thoracic back: Normal. No deformity, spasms, tenderness or bony tenderness. Normal range of motion.     Lumbar back: Normal. No deformity, spasms, tenderness or bony tenderness. Normal range of motion.  Neurological:     Mental Status: She is alert. Mental status is at baseline.  Psychiatric:        Mood and Affect: Mood normal.        Behavior: Behavior normal.     ED Results / Procedures / Treatments   Labs (all labs ordered are listed, but  only abnormal results are displayed) Labs Reviewed - No data to display  EKG None  Radiology CT Head Wo Contrast  Result Date: 12/11/2022 CLINICAL DATA:  Status post fall. EXAM: CT HEAD WITHOUT CONTRAST CT CERVICAL SPINE WITHOUT CONTRAST TECHNIQUE: Multidetector CT imaging of the head and cervical spine was performed following the standard protocol without intravenous contrast. Multiplanar CT image reconstructions of the cervical spine were also generated. RADIATION DOSE REDUCTION: This exam was performed according to the departmental dose-optimization program which includes automated exposure control, adjustment of the mA and/or kV according to patient size and/or use of iterative reconstruction technique. COMPARISON:  None Available. FINDINGS: CT HEAD FINDINGS Brain: No evidence of acute infarction, hemorrhage, hydrocephalus, extra-axial collection or mass lesion/mass effect. There is patchy low-attenuation within the subcortical and periventricular white matter compatible with chronic microvascular disease. Remote left basal ganglia lacunar infarcts identified. Vascular: No hyperdense vessel or unexpected calcification. Skull: Normal. Negative for fracture or focal lesion. Sinuses/Orbits: No acute finding. Other: None CT CERVICAL SPINE FINDINGS Alignment: No acute posttraumatic malalignment of the cervical spine. Mild reversal of normal cervical lordosis may reflect muscle spasm or patient positioning. Skull base and vertebrae: No acute fracture. No primary bone lesion or focal pathologic process. Soft tissues and spinal canal: No prevertebral fluid or swelling. No visible canal hematoma. Disc levels: Multilevel disc space narrowing and endplate spurring is noted from C3-4 through C6-7. Upper chest: Negative. Other: None IMPRESSION: 1. No acute intracranial abnormality. 2. Chronic microvascular disease. 3. No evidence for cervical spine fracture or subluxation. 4. Cervical degenerative disc disease.  Electronically Signed   By: Kerby Moors M.D.   On: 12/11/2022 13:56   CT Cervical Spine Wo Contrast  Result Date: 12/11/2022 CLINICAL DATA:  Status post fall. EXAM: CT HEAD WITHOUT CONTRAST CT CERVICAL SPINE WITHOUT CONTRAST TECHNIQUE: Multidetector CT imaging of the head and cervical spine was performed following the standard protocol without intravenous contrast. Multiplanar CT image reconstructions of the cervical spine were also generated. RADIATION DOSE REDUCTION: This exam was performed according to the departmental dose-optimization program which includes automated exposure control, adjustment of the mA and/or kV according to patient size and/or use of iterative reconstruction technique. COMPARISON:  None Available. FINDINGS: CT HEAD FINDINGS Brain: No evidence of acute infarction, hemorrhage, hydrocephalus, extra-axial collection or mass lesion/mass effect. There is patchy low-attenuation within the subcortical and periventricular white matter compatible with chronic microvascular disease. Remote left basal ganglia lacunar infarcts identified. Vascular: No hyperdense vessel or unexpected calcification. Skull: Normal. Negative for fracture or focal lesion. Sinuses/Orbits: No acute finding. Other: None CT CERVICAL SPINE FINDINGS Alignment: No acute posttraumatic malalignment of the cervical spine. Mild reversal of normal cervical lordosis  may reflect muscle spasm or patient positioning. Skull base and vertebrae: No acute fracture. No primary bone lesion or focal pathologic process. Soft tissues and spinal canal: No prevertebral fluid or swelling. No visible canal hematoma. Disc levels: Multilevel disc space narrowing and endplate spurring is noted from C3-4 through C6-7. Upper chest: Negative. Other: None IMPRESSION: 1. No acute intracranial abnormality. 2. Chronic microvascular disease. 3. No evidence for cervical spine fracture or subluxation. 4. Cervical degenerative disc disease. Electronically  Signed   By: Kerby Moors M.D.   On: 12/11/2022 13:56   DG Scapula Left  Result Date: 12/11/2022 CLINICAL DATA:  Status post fall. EXAM: LEFT SCAPULA - 2+ VIEWS COMPARISON:  None Available. FINDINGS: No signs of acute fracture or dislocation. There are mild glenohumeral joint degenerative changes with spurring off the inferior aspect of the humeral head. Soft tissues are unremarkable. IMPRESSION: 1. No acute findings. 2. Mild glenohumeral joint osteoarthritis. Electronically Signed   By: Kerby Moors M.D.   On: 12/11/2022 13:25    Procedures Procedures    Medications Ordered in ED Medications - No data to display  ED Course/ Medical Decision Making/ A&P                             Medical Decision Making Amount and/or Complexity of Data Reviewed Radiology: ordered.   This patient presents to the ED with chief complaint(s) of injuries following a fall with pertinent past medical history of hypothyroidism.  The complaint involves an extensive differential diagnosis and also carries with it a high risk of complications and morbidity.    The differential diagnosis includes intracranial injury, skull fracture, contusion, scapular fracture, muscular strain   The initial plan is to obtain CT of head, cervical spine and plain x-ray of L scapula  Initial Assessment:   On exam, patient is alert, oriented and is not in acute distress.  She is moving all extremities appropriately.  There is a 1.5 cm hematoma over the left brow with superficial abrasion.  No bleeding.  Tenderness to palpation over this area.  There is no instability of maxillofacial bones.  No raccoon eyes.  Nose is normal.  Cervical spine without tenderness, full range of motion without pain.  Tenderness to palpation over the superior trapezius overlying the left scapula.  She has mildly reduced range of motion of the shoulder due to discomfort.  No obvious gross deformities.  No contusions, abrasions, ecchymosis to patient's  back.    Independent visualization and interpretation of imaging: I independently visualized the following imaging with scope of interpretation limited to determining acute life threatening conditions related to emergency care: CT head and cervical spine, which revealed no evidence of acute intracranial injury, skull fracture, and no fracture or subluxation to cervical spine.  X-ray of left scapula without bony fracture, there is some mild arthritis.  I agree with radiologist interpretation.    Disposition:   I believe that patient is appropriate for discharge home.  Advised patient to take NSAIDs as needed for musculoskeletal pain.   The patient has been appropriately medically screened and/or stabilized in the ED. I have low suspicion for any other emergent medical condition which would require further screening, evaluation or treatment in the ED or require inpatient management. At time of discharge the patient is hemodynamically stable and in no acute distress. I have discussed work-up results and diagnosis with patient and answered all questions. Patient is agreeable with discharge plan.  We discussed strict return precautions for returning to the emergency department and they verbalized understanding.            Final Clinical Impression(s) / ED Diagnoses Final diagnoses:  Contusion of face, initial encounter  Minor head injury, initial encounter  Pain of left scapula  Injury due to fall, initial encounter    Rx / DC Orders ED Discharge Orders     None         Pat Kocher, PA-C 12/11/22 1412    Regan Lemming, MD 12/12/22 1810
